# Patient Record
Sex: Male | Born: 1980 | Race: White | Hispanic: No | Marital: Single | Smoking: Former smoker
Health system: Southern US, Community
[De-identification: ages and names within clinical notes are randomized; demographics above are authoritative.]

## PROBLEM LIST (undated history)

## (undated) DIAGNOSIS — J309 Allergic rhinitis, unspecified: Secondary | ICD-10-CM

## (undated) HISTORY — DX: Allergic rhinitis, unspecified: J30.9

---

## 2002-09-07 ENCOUNTER — Emergency Department (HOSPITAL_COMMUNITY): Admission: EM | Admit: 2002-09-07 | Discharge: 2002-09-08 | Payer: Self-pay | Admitting: Emergency Medicine

## 2002-09-07 ENCOUNTER — Encounter: Payer: Self-pay | Admitting: Emergency Medicine

## 2005-07-12 ENCOUNTER — Ambulatory Visit: Payer: Self-pay | Admitting: Internal Medicine

## 2005-08-06 ENCOUNTER — Ambulatory Visit: Payer: Self-pay | Admitting: Internal Medicine

## 2005-11-05 ENCOUNTER — Ambulatory Visit: Payer: Self-pay | Admitting: Urology

## 2005-12-09 ENCOUNTER — Ambulatory Visit: Payer: Self-pay | Admitting: Internal Medicine

## 2006-05-11 ENCOUNTER — Encounter: Payer: Self-pay | Admitting: *Deleted

## 2006-05-11 ENCOUNTER — Ambulatory Visit: Payer: Self-pay | Admitting: Internal Medicine

## 2006-05-11 DIAGNOSIS — H6690 Otitis media, unspecified, unspecified ear: Secondary | ICD-10-CM | POA: Insufficient documentation

## 2006-08-12 ENCOUNTER — Telehealth (INDEPENDENT_AMBULATORY_CARE_PROVIDER_SITE_OTHER): Payer: Self-pay | Admitting: *Deleted

## 2006-08-15 ENCOUNTER — Ambulatory Visit: Payer: Self-pay | Admitting: Internal Medicine

## 2006-08-15 DIAGNOSIS — I951 Orthostatic hypotension: Secondary | ICD-10-CM | POA: Insufficient documentation

## 2006-08-17 LAB — CONVERTED CEMR LAB
ALT: 20 units/L (ref 0–53)
AST: 19 units/L (ref 0–37)
Albumin: 4.5 g/dL (ref 3.5–5.2)
Alkaline Phosphatase: 55 units/L (ref 39–117)
BUN: 7 mg/dL (ref 6–23)
Basophils Absolute: 0.1 10*3/uL (ref 0.0–0.1)
Basophils Relative: 1.2 % — ABNORMAL HIGH (ref 0.0–1.0)
Bilirubin, Direct: 0.1 mg/dL (ref 0.0–0.3)
CO2: 31 meq/L (ref 19–32)
Calcium: 9.4 mg/dL (ref 8.4–10.5)
Chloride: 104 meq/L (ref 96–112)
Creatinine, Ser: 0.9 mg/dL (ref 0.4–1.5)
Eosinophils Absolute: 0 10*3/uL (ref 0.0–0.6)
Eosinophils Relative: 0.3 % (ref 0.0–5.0)
GFR calc Af Amer: 131 mL/min
GFR calc non Af Amer: 108 mL/min
Glucose, Bld: 93 mg/dL (ref 70–99)
HCT: 49.5 % (ref 39.0–52.0)
Hemoglobin: 16.9 g/dL (ref 13.0–17.0)
Lymphocytes Relative: 21.9 % (ref 12.0–46.0)
MCHC: 34.2 g/dL (ref 30.0–36.0)
MCV: 90.4 fL (ref 78.0–100.0)
Monocytes Absolute: 0.2 10*3/uL (ref 0.2–0.7)
Monocytes Relative: 2.6 % — ABNORMAL LOW (ref 3.0–11.0)
Neutro Abs: 6.9 10*3/uL (ref 1.4–7.7)
Neutrophils Relative %: 74 % (ref 43.0–77.0)
Phosphorus: 3.7 mg/dL (ref 2.3–4.6)
Platelets: 315 10*3/uL (ref 150–400)
Potassium: 3.8 meq/L (ref 3.5–5.1)
RBC: 5.48 M/uL (ref 4.22–5.81)
RDW: 11.8 % (ref 11.5–14.6)
Sodium: 142 meq/L (ref 135–145)
TSH: 1.18 microintl units/mL (ref 0.35–5.50)
Total Bilirubin: 0.9 mg/dL (ref 0.3–1.2)
Total Protein: 6.9 g/dL (ref 6.0–8.3)
WBC: 9.2 10*3/uL (ref 4.5–10.5)

## 2006-11-10 ENCOUNTER — Ambulatory Visit: Payer: Self-pay | Admitting: Family Medicine

## 2006-11-10 DIAGNOSIS — J019 Acute sinusitis, unspecified: Secondary | ICD-10-CM | POA: Insufficient documentation

## 2007-02-10 ENCOUNTER — Ambulatory Visit: Payer: Self-pay | Admitting: Family Medicine

## 2007-03-31 ENCOUNTER — Ambulatory Visit: Payer: Self-pay | Admitting: Family Medicine

## 2007-04-26 ENCOUNTER — Ambulatory Visit: Payer: Self-pay | Admitting: Family Medicine

## 2007-04-26 DIAGNOSIS — L259 Unspecified contact dermatitis, unspecified cause: Secondary | ICD-10-CM | POA: Insufficient documentation

## 2007-06-19 ENCOUNTER — Telehealth: Payer: Self-pay | Admitting: Family Medicine

## 2007-06-20 ENCOUNTER — Ambulatory Visit: Payer: Self-pay | Admitting: Family Medicine

## 2007-06-20 DIAGNOSIS — M25519 Pain in unspecified shoulder: Secondary | ICD-10-CM | POA: Insufficient documentation

## 2007-08-04 ENCOUNTER — Encounter: Payer: Self-pay | Admitting: Family Medicine

## 2007-09-28 ENCOUNTER — Ambulatory Visit: Payer: Self-pay | Admitting: Family Medicine

## 2007-09-28 DIAGNOSIS — R1031 Right lower quadrant pain: Secondary | ICD-10-CM | POA: Insufficient documentation

## 2007-09-28 LAB — CONVERTED CEMR LAB
Blood in Urine, dipstick: NEGATIVE
Glucose, Urine, Semiquant: NEGATIVE
Protein, U semiquant: NEGATIVE
Urobilinogen, UA: 0.2
WBC Urine, dipstick: NEGATIVE
pH: 7

## 2008-04-23 ENCOUNTER — Ambulatory Visit: Payer: Self-pay | Admitting: Family Medicine

## 2008-04-23 DIAGNOSIS — F17200 Nicotine dependence, unspecified, uncomplicated: Secondary | ICD-10-CM | POA: Insufficient documentation

## 2008-08-29 ENCOUNTER — Ambulatory Visit: Payer: Self-pay | Admitting: Family Medicine

## 2008-08-29 DIAGNOSIS — M24819 Other specific joint derangements of unspecified shoulder, not elsewhere classified: Secondary | ICD-10-CM | POA: Insufficient documentation

## 2008-09-05 ENCOUNTER — Encounter: Payer: Self-pay | Admitting: Family Medicine

## 2008-10-08 ENCOUNTER — Encounter: Payer: Self-pay | Admitting: Family Medicine

## 2008-10-09 ENCOUNTER — Ambulatory Visit: Payer: Self-pay | Admitting: Family Medicine

## 2009-01-30 ENCOUNTER — Ambulatory Visit: Payer: Self-pay | Admitting: Family Medicine

## 2009-01-30 DIAGNOSIS — J069 Acute upper respiratory infection, unspecified: Secondary | ICD-10-CM | POA: Insufficient documentation

## 2009-04-10 ENCOUNTER — Ambulatory Visit: Payer: Self-pay | Admitting: Family Medicine

## 2009-04-10 LAB — CONVERTED CEMR LAB
Urobilinogen, UA: 0.2
WBC Urine, dipstick: NEGATIVE
pH: 6

## 2009-04-11 LAB — CONVERTED CEMR LAB
Basophils Absolute: 0 10*3/uL (ref 0.0–0.1)
Basophils Relative: 0 % (ref 0.0–3.0)
Eosinophils Absolute: 0 10*3/uL (ref 0.0–0.7)
Eosinophils Relative: 0.2 % (ref 0.0–5.0)
Lymphocytes Relative: 28.8 % (ref 12.0–46.0)
MCHC: 33.3 g/dL (ref 30.0–36.0)
Monocytes Absolute: 0.6 10*3/uL (ref 0.1–1.0)
RDW: 11.9 % (ref 11.5–14.6)
WBC: 9.1 10*3/uL (ref 4.5–10.5)

## 2010-03-03 NOTE — Miscellaneous (Signed)
Summary: PT Initial Note/[facility]  PT Initial Note/[facility]   Imported By: Lanelle Bal 09/17/2008 14:44:55  _____________________________________________________________________  External Attachment:    Type:   Image     Comment:   External Document

## 2010-03-03 NOTE — Assessment & Plan Note (Signed)
Summary: pain in stomach/ alc   Vital Signs:  Patient profile:   30 year old male Height:      68 inches Weight:      134.13 pounds BMI:     20.47 Temp:     98.1 degrees F oral Pulse rate:   76 / minute Pulse rhythm:   regular BP sitting:   110 / 72  (left arm) Cuff size:   regular  Vitals Entered By: Benny Lennert CMA Duncan Dull) (April 10, 2009 10:47 AM) CC: pain in stomach   History of Present Illness: 30 year old male:  Pain in the belly button and shooting down to his penis. no std exposure, no penile discharge. some diffuse achiness and not feeling that good. week. hurt with peeing  feel pretty bad no d, no nausea no rashes  no uri type signs   ros above  GEN: WDWN, NAD, Non-toxic, A & O x 3 HEENT: Atraumatic, Normocephalic. Neck supple. No masses, No LAD. Ears and Nose: No external deformity. CV: RRR, No M/G/R. No JVD. No thrill. No extra heart sounds. PULM: CTA B, no wheezes, crackles, rhonchi. No retractions. No resp. distress. No accessory muscle use. ABD: S, NT, ND, +BS. No rebound tenderness. No HSM.  EXTR: No c/c/e NEURO: Normal gait.  PSYCH: Normally interactive. Conversant. Not depressed or anxious appearing.  Calm demeanor.      Allergies: 1)  * Allegra (Fexofenadine)  Past History:  Past medical, surgical, family and social histories (including risk factors) reviewed, and no changes noted (except as noted below).  Past Medical History: Reviewed history from 08/26/2006 and no changes required. Allergic rhinitis  Family History: Reviewed history from 08/26/2006 and no changes required. Father: Alive Mother: Alive Doesn't really know his family's medical hx  Social History: Reviewed history from 08/26/2006 and no changes required. Marital Status: Single Children: None Occupation: Engineer, maintenance Current Smoker Alcohol use-no   Impression & Recommendations:  Problem # 1:  VIRAL INFECTION-UNSPEC (ICD-079.99) Assessment New probable  viral syndrome, normal UA, normal CBC  reassurance, supportive care.  The following medications were removed from the medication list:    Nyquil 60-7.06-30-998 Mg/10ml Liqd (Pseudoeph-doxylamine-dm-apap) ..... Otc as directed.    Dayquil Multi-symptom 30-325-10 Mg/68ml Liqd (Pseudoephedrine-apap-dm) ..... Otc as directed.  Orders: Venipuncture (82956) TLB-CBC Platelet - w/Differential (85025-CBCD) UA Dipstick w/o Micro (manual) (21308)  Prior Medications (reviewed today): None Current Allergies (reviewed today): * ALLEGRA (FEXOFENADINE)  Laboratory Results   Urine Tests  Date/Time Received: April 10, 2009 11:11 AM  Date/Time Reported: April 10, 2009 11:11 AM   Routine Urinalysis   Color: yellow Appearance: Clear Glucose: negative   (Normal Range: Negative) Bilirubin: negative   (Normal Range: Negative) Ketone: negative   (Normal Range: Negative) Spec. Gravity: 1.015   (Normal Range: 1.003-1.035) Blood: negative   (Normal Range: Negative) pH: 6.0   (Normal Range: 5.0-8.0) Protein: negative   (Normal Range: Negative) Urobilinogen: 0.2   (Normal Range: 0-1) Nitrite: negative   (Normal Range: Negative) Leukocyte Esterace: negative   (Normal Range: Negative)

## 2010-07-09 ENCOUNTER — Ambulatory Visit (INDEPENDENT_AMBULATORY_CARE_PROVIDER_SITE_OTHER): Payer: BC Managed Care – PPO | Admitting: Family Medicine

## 2010-07-09 ENCOUNTER — Encounter: Payer: Self-pay | Admitting: Family Medicine

## 2010-07-09 VITALS — BP 100/70 | HR 72 | Temp 98.6°F | Ht 68.0 in | Wt 135.2 lb

## 2010-07-09 DIAGNOSIS — J0100 Acute maxillary sinusitis, unspecified: Secondary | ICD-10-CM

## 2010-07-09 MED ORDER — DOXYCYCLINE HYCLATE 100 MG PO TABS: 100.0000 mg | ORAL_TABLET | Freq: Two times a day (BID) | ORAL | Status: AC

## 2010-07-09 NOTE — Patient Instructions (Signed)
Suggestions  TREATMENT 1. Drink plenty of fluids, but limit caffeine 2. Decongestant: for congested noses, sinuses, and ear tubes. Pressure release and help drainage: Sudafed (pseudephedrine or Phenylephrine) (NOT IF YOU HAVE HIGH BLOOD PRESSURE) 3. Nasal Sprays: Relieve pressure, promote drainage, open nasal and ear passages. Afrin or Neosynephrine can be used for only 3-4 days in row. 4. Nasal Saline: Moisten and smooth membranes, no side effects 5. Cough Suppressants: Several types over the counter such as DM. Codeine and Hydrocodone are prescription narcotic medicines that are powerful cough suppressants. DM cough suppressant usually are well tolerated with minimal side effects 6. Expectorants: Liquify secretions and improve drainage. (ex=Robitussin or Mucinex)  Combinations: often have combination of decongestant, cough suppressant, and expectorant. NO BRAND IS PARTICULARLY BETTER THAN ANOTHER - THEY WILL MAKE YOU FEEL BETTER FOR A WHILE, SO YOU CAN DEAL WITH YOUR SYMPTOMS. YOUR BODY HAS TO HEAL ITSELF.  COMBINATION EXAMPLES: Mucinex-D (expectorant and decongestant) Dayquil and Nyquil Tylenol or Advil Sinus, Cold, or Flu Theraflu Dimetapp  Work well, but read label so you do not combine multiple meds.  IF CONFUSED AT ALL: YOU CAN ALWAYS ASK THE PHARMACIST TO HELP

## 2010-07-09 NOTE — Progress Notes (Signed)
  Subjective:     Seth Bates is a 30 y.o. male who presents for evaluation of sinus pain. Symptoms include: clear rhinorrhea, congestion, facial pain, fevers, headaches, nasal congestion and sore throat. Onset of symptoms was 10 days ago. Symptoms have been gradually worsening since that time. Past history is significant for tick bite. Patient is a smoker  (1 ppd x 10 yrs).  The PMH, PSH, Social History, Family History, Medications, and allergies have been reviewed in [facility], and have been updated if relevant.  Review of Systems ROS: GEN: Acute illness details above GI: Tolerating PO intake GU: maintaining adequate hydration and urination Pulm: No SOB Interactive and getting along well at home.  Otherwise, ROS is as per the HPI.   Objective:     Physical Exam  Blood pressure 100/70, pulse 72, temperature 98.6 F (37 C), temperature source Oral, height 5\' 8"  (1.727 m), weight 135 lb 4 oz (61.349 kg).  Gen: WDWN, NAD; alert,appropriate and cooperative throughout exam  HEENT: Normocephalic and atraumatic. Throat clear, w/o exudate, no LAD, R TM clear, L TM - good landmarks, No fluid present. rhinnorhea.  Left frontal and maxillary sinuses: Tender max Right frontal and maxillary sinuses: Tender max  Neck: No ant or post LAD CV: RRR, No M/G/R Pulm: Breathing comfortably in no resp distress. no w/c/r Extr: no c/c/e Psych: full affect, pleasant   Assessment:    Acute bacterial sinusitis.  Tick bite   Plan:    Nasal saline sprays. Placed on doxy given tick bite per medication orders.

## 2011-02-05 ENCOUNTER — Ambulatory Visit (INDEPENDENT_AMBULATORY_CARE_PROVIDER_SITE_OTHER): Payer: BC Managed Care – PPO | Admitting: Family Medicine

## 2011-02-05 ENCOUNTER — Encounter: Payer: Self-pay | Admitting: Family Medicine

## 2011-02-05 ENCOUNTER — Encounter: Payer: Self-pay | Admitting: Internal Medicine

## 2011-02-05 VITALS — BP 138/96 | HR 84 | Temp 98.4°F | Resp 20 | Ht 66.0 in | Wt 138.8 lb

## 2011-02-05 DIAGNOSIS — J069 Acute upper respiratory infection, unspecified: Secondary | ICD-10-CM

## 2011-02-05 DIAGNOSIS — U071 COVID-19: Secondary | ICD-10-CM | POA: Insufficient documentation

## 2011-02-05 MED ORDER — GUAIFENESIN-CODEINE 100-10 MG/5ML PO SYRP: 5.0000 mL | ORAL_SOLUTION | Freq: Two times a day (BID) | ORAL | Status: AC | PRN

## 2011-02-05 NOTE — Assessment & Plan Note (Signed)
Supportive care. cheratussin for cough. Update [place] if not improving as expected.

## 2011-02-05 NOTE — Progress Notes (Signed)
  Subjective:    Patient ID: Seth Bates, male    DOB: [birth year], 30 y.o.   MRN: [id number]  HPI CC: ST, cold sxs  4-5 d h/o cold sxs, then started moving up into head.  + eye pain, tooth pain, ears muffled.  Clearing up some.  Some ST.  Mild cough productive of mucous as well as out of nose.  Some stomach upset.  Has tried sinus and cold medicine (?decongestant attributed to elevated bp today).  Also taking mucinex.  No fevers/chills, nausea/vomiting, abd pain, diarrhea, rashes.  No chest pain, SOB.  + wife and daughter sick recently.  Smoking 1 ppd.  No h/o asthma, COPD.  Review of Systems Per HPI    Objective:   Physical Exam  Nursing note and vitals reviewed. Constitutional: He appears well-developed and well-nourished. No distress.  HENT:  Head: Normocephalic and atraumatic.  Right Ear: Hearing, tympanic membrane, external ear and ear canal normal.  Left Ear: Hearing, tympanic membrane, external ear and ear canal normal.  Nose: Nose normal. No mucosal edema or rhinorrhea. Right sinus exhibits no maxillary sinus tenderness and no frontal sinus tenderness. Left sinus exhibits no maxillary sinus tenderness and no frontal sinus tenderness.  Mouth/Throat: Uvula is midline, oropharynx is clear and moist and mucous membranes are normal. No oropharyngeal exudate, posterior oropharyngeal edema, posterior oropharyngeal erythema or tonsillar abscesses.       Some congested TM L>R  Eyes: Conjunctivae and EOM are normal. Pupils are equal, round, and reactive to light. No scleral icterus.  Neck: Normal range of motion. Neck supple.  Cardiovascular: Normal rate, regular rhythm, normal heart sounds and intact distal pulses.   No murmur heard. Pulmonary/Chest: Effort normal and breath sounds normal. No respiratory distress. He has no wheezes. He has no rales.  Lymphadenopathy:    He has no cervical adenopathy.  Skin: Skin is warm and dry. No rash noted.       Assessment & Plan:

## 2011-02-05 NOTE — Patient Instructions (Signed)
Sounds like you have a viral upper respiratory infection. Antibiotics are not needed for this.  Viral infections usually take 7-10 days to resolve.  The cough can last several weeks to go away. Use medication as prescribed: cheratussin for cough at night (may make you sleepy) Push fluids and plenty of rest. May use simple mucinex with plenty of fluid to mobilize mucous. if you have high fevers (>101.5) or worsening productive cough, or symptoms going on past 10 days, please call us. Call clinic with questions.  Good to see you today.

## 2011-02-08 ENCOUNTER — Encounter: Payer: Self-pay | Admitting: *Deleted

## 2011-02-08 ENCOUNTER — Encounter: Payer: Self-pay | Admitting: Family Medicine

## 2011-02-08 ENCOUNTER — Ambulatory Visit (INDEPENDENT_AMBULATORY_CARE_PROVIDER_SITE_OTHER): Payer: BC Managed Care – PPO | Admitting: Family Medicine

## 2011-02-08 DIAGNOSIS — B309 Viral conjunctivitis, unspecified: Secondary | ICD-10-CM | POA: Insufficient documentation

## 2011-02-08 DIAGNOSIS — J069 Acute upper respiratory infection, unspecified: Secondary | ICD-10-CM

## 2011-02-08 NOTE — Patient Instructions (Signed)
Sounds like you have developed pink eye - treat with warm compresses to eye several times daily, lubricating eye drops like hypotears or rephresh, wash pillow cloth at night time to prevent spread, wash hands. If worsening discharge, call me for antibiotic eye drops. If not improving cold symptoms by Wednesday or Thursday, call me as well .  Viral Conjunctivitis Conjunctivitis is an irritation (inflammation) of the clear membrane that covers the white part of the eye (the conjunctiva). The irritation can also happen on the underside of the eyelids. Conjunctivitis makes the eye red or pink in color. This is what is commonly known as pink eye. Viral conjunctivitis can spread easily (contagious). CAUSES   Infection from virus on the surface of the eye.   Infection from the irritation or injury of nearby tissues such as the eyelids or cornea.   More serious inflammation or infection on the inside of the eye.   Other eye diseases.   The use of certain eye medications.  SYMPTOMS  The normally white color of the eye or the underside of the eyelid is usually pink or red in color. The pink eye is usually associated with irritation, tearing and some sensitivity to light. Viral conjunctivitis is often associated with a clear, watery discharge. If a discharge is present, there may also be some blurred vision in the affected eye. DIAGNOSIS  Conjunctivitis is diagnosed by an eye exam. The eye specialist looks for changes in the surface tissues of the eye which take on changes characteristic of the specific types of conjunctivitis. A sample of any discharge may be collected on a Q-Tip (sterile swap). The sample will be sent to a lab to see whether or not the inflammation is caused by bacterial or viral infection. TREATMENT  Viral conjunctivitis will not respond to medicines that kill germs (antibiotics). Treatment is aimed at stopping a bacterial infection on top of the viral infection. The goal of treatment  is to relieve symptoms (such as itching) with antihistamine drops or other eye medications.  HOME CARE INSTRUCTIONS   To ease discomfort, apply a cool, clean wash cloth to your eye for 10 to 20 minutes, 3 to 4 times a day.   Gently wipe away any drainage from the eye with a warm, wet washcloth or a cotton ball.   Wash your hands often with soap and use paper towels to dry.   Do not share towels or washcloths. This may spread the infection.   Change or wash your pillowcase every day.   You should not use eye make-up until the infection is gone.   Stop using contacts lenses. Ask your eye professional how to sterilize or replace them before using again. This depends on the type of contact lenses used.   Do not touch the edge of the eyelid with the eye drop bottle or ointment tube when applying medications to the affected eye. This will stop you from spreading the infection to the other eye or to others.  SEEK IMMEDIATE MEDICAL CARE IF:   The infection has not improved within 3 days of beginning treatment.   A watery discharge from the eye develops.   Pain in the eye increases.   The redness is spreading.   Vision becomes blurred.   An oral temperature above 102 F (38.9 C) develops, or as your caregiver suggests.   Facial pain, redness or swelling develops.   Any problems that may be related to the prescribed medicine develop.  MAKE SURE YOU:  Understand these instructions.   Will watch your condition.   Will get help right away if you are not doing well or get worse.  Document Released: 01/18/2005 Document Revised: 09/30/2010 Document Reviewed: 09/07/2007 [facility] Patient Information 2012 [place], [place].

## 2011-02-08 NOTE — Assessment & Plan Note (Signed)
Resolving.  Update [place] if not improving as expected for tx possible sinusitis.

## 2011-02-08 NOTE — Progress Notes (Signed)
  Subjective:    Patient ID: Seth Bates, male    DOB: 1980-10-03, 31 y.o.   MRN: [id number]  HPI CC: f/u, red eye  Seen here 02/05/2011 with dx viral URTI with cough, treated with cheratussin which helps at night.  Woke up Saturday morning with red eye, green matted discharge in am.  When awakening and blows nose, blood tinged mucous.  Nasal congestion improving.  + HA when awakens.  + tooth pain on left.  sxs now going on for 8 days but seem to be improving.  Denies blurry vision, vision changes.  No pain with eye movements but eye staying some sore.  no fevers/chills, nausea.  No ear pain.  trying to back down on smoking.  See prior note.  Review of Systems Per HPI    Objective:   Physical Exam  Nursing note and vitals reviewed. Constitutional: He appears well-developed and well-nourished. No distress.  HENT:  Head: Normocephalic and atraumatic.  Right Ear: Hearing, tympanic membrane, external ear and ear canal normal.  Left Ear: Hearing, tympanic membrane, external ear and ear canal normal.  Nose: Nose normal. No mucosal edema or rhinorrhea. Right sinus exhibits no maxillary sinus tenderness and no frontal sinus tenderness. Left sinus exhibits no maxillary sinus tenderness and no frontal sinus tenderness.  Mouth/Throat: Uvula is midline and mucous membranes are normal. Posterior oropharyngeal erythema present. No oropharyngeal exudate, posterior oropharyngeal edema or tonsillar abscesses.  Eyes: EOM are normal. Pupils are equal, round, and reactive to light. Right conjunctiva is injected. Right conjunctiva has no hemorrhage. No scleral icterus. Right eye exhibits normal extraocular motion and no nystagmus. Left eye exhibits normal extraocular motion and no nystagmus.         Limbic sparing.  + bulbar conjunctivitis Nl eye movements without pain  Neck: Normal range of motion. Neck supple.  Cardiovascular: Normal rate, regular rhythm, normal heart sounds and intact distal  pulses.   No murmur heard. Pulmonary/Chest: Effort normal and breath sounds normal. No respiratory distress. He has no wheezes. He has no rales.  Lymphadenopathy:    He has no cervical adenopathy.  Skin: Skin is warm and dry. No rash noted.       Assessment & Plan:

## 2011-02-08 NOTE — Assessment & Plan Note (Signed)
After viral URTI. Supportive care, see pt instructions. No pain with eye movements, no proptosis

## 2011-10-21 ENCOUNTER — Encounter: Payer: Self-pay | Admitting: Family Medicine

## 2011-10-21 ENCOUNTER — Ambulatory Visit (INDEPENDENT_AMBULATORY_CARE_PROVIDER_SITE_OTHER): Payer: BC Managed Care – PPO | Admitting: Family Medicine

## 2011-10-21 ENCOUNTER — Encounter: Payer: Self-pay | Admitting: *Deleted

## 2011-10-21 VITALS — BP 110/80 | HR 80 | Temp 98.2°F | Wt 141.2 lb

## 2011-10-21 DIAGNOSIS — K529 Noninfective gastroenteritis and colitis, unspecified: Secondary | ICD-10-CM | POA: Insufficient documentation

## 2011-10-21 DIAGNOSIS — K5289 Other specified noninfective gastroenteritis and colitis: Secondary | ICD-10-CM

## 2011-10-21 DIAGNOSIS — F17200 Nicotine dependence, unspecified, uncomplicated: Secondary | ICD-10-CM

## 2011-10-21 MED ORDER — CIPROFLOXACIN HCL 500 MG PO TABS: 500.0000 mg | ORAL_TABLET | Freq: Two times a day (BID) | ORAL | Status: DC

## 2011-10-21 NOTE — Assessment & Plan Note (Signed)
Continue to encourage cessation. 

## 2011-10-21 NOTE — Assessment & Plan Note (Addendum)
Nontoxic today, well hydrated. Anticipate viral enteritis, but given # of BM's/day today, provided with script for cipro to fill if not improving as expected. Discussed importance of hydration status.

## 2011-10-21 NOTE — Patient Instructions (Addendum)
I think you have enteritis - or bowel infection. Should improve on own. If not improving as expected, fill antibiotic.  If worsening, please return. Get plenty of rest and plenty of fluids throughout the day Out of work today.

## 2011-10-21 NOTE — Progress Notes (Signed)
  Subjective:    Patient ID: Seth Bates, male    DOB: 1980-06-07, 31 y.o.   MRN: [id number]  HPI CC: abd pain, diarrhea  Sxs started Saturday night (5d ago) with mild soreness RLL.  For last 5 days, intermittent lower abd cramping associated with diarrhea.  Diarrhea loose not watery, today some mucous.  This has been associated with malaise.  Diarrhea 10-15 x today.  Appetite ok.  No recent abx use.  Hasn't tried anything OTC for this.  No fevers/chills, blood in stool, melena, nausea/vomiting.  No dysuria, no viral sxs (RN, cough, ST, sneezing)  No sick contacts at home.  No new foods, new restaurants.  Past Medical History  Diagnosis Date  . Allergy     allergic rhnitis     Review of Systems Per HPI    Objective:   Physical Exam  Nursing note and vitals reviewed. Constitutional: He appears well-developed and well-nourished. No distress.  HENT:  Head: Normocephalic and atraumatic.  Mouth/Throat: Oropharynx is clear and moist. No oropharyngeal exudate.  Neck: Normal range of motion. Neck supple.  Cardiovascular: Normal rate, regular rhythm, normal heart sounds and intact distal pulses.   No murmur heard. Pulmonary/Chest: Effort normal and breath sounds normal. No respiratory distress. He has no wheezes. He has no rales.  Abdominal: Soft. Bowel sounds are normal. He exhibits no distension and no mass. There is no hepatosplenomegaly. There is tenderness (mild) in the suprapubic area. There is no rebound, no guarding and no CVA tenderness.  Musculoskeletal: He exhibits no edema.  Lymphadenopathy:    He has no cervical adenopathy.  Skin: Skin is warm and dry. No rash noted.       Assessment & Plan:

## 2012-02-07 ENCOUNTER — Ambulatory Visit (INDEPENDENT_AMBULATORY_CARE_PROVIDER_SITE_OTHER): Payer: BC Managed Care – PPO | Admitting: Family Medicine

## 2012-02-07 ENCOUNTER — Encounter: Payer: Self-pay | Admitting: Family Medicine

## 2012-02-07 VITALS — BP 122/86 | HR 83 | Temp 98.4°F | Wt 138.5 lb

## 2012-02-07 DIAGNOSIS — R42 Dizziness and giddiness: Secondary | ICD-10-CM | POA: Insufficient documentation

## 2012-02-07 DIAGNOSIS — T7840XA Allergy, unspecified, initial encounter: Secondary | ICD-10-CM

## 2012-02-07 DIAGNOSIS — J309 Allergic rhinitis, unspecified: Secondary | ICD-10-CM

## 2012-02-07 MED ORDER — FLUTICASONE PROPIONATE 50 MCG/ACT NA SUSP: 2.0000 | Freq: Every day | NASAL | Status: DC

## 2012-02-07 MED ORDER — MECLIZINE HCL 25 MG PO TABS: 25.0000 mg | ORAL_TABLET | Freq: Three times a day (TID) | ORAL | Status: DC | PRN

## 2012-02-07 NOTE — Assessment & Plan Note (Signed)
Peripheral vertigo - ?labrynthitis vs vest neuritis. Not consistent with BPPV, neg dix hallpike. Not consistent with central cause. Discussed dx and treatment plan.  Use meclizine prn. ?allergic rhinitis related - trial of flonase.  See below.

## 2012-02-07 NOTE — Patient Instructions (Signed)
I wonder if this is labrynthitis.  It should get better with time. Treat with meclizine. For allergies, use flonase and over the counter nasal saline. Watch for nose bleeds on flonase, if happening frequently then stop  Labyrinthitis (Inner Ear Inflammation) Your exam shows you have an inner ear disturbance or labyrinthitis. The cause of this condition is not known. But it may be due to a virus infection. The symptoms of labyrinthitis include vertigo or dizziness made worse by motion, nausea and vomiting. The onset of labyrinthitis may be very sudden. It usually lasts for a few days and then clears up over 1-2 weeks. The treatment of an inner ear disturbance includes bed rest and medications to reduce dizziness, nausea, and vomiting. You should stay away from alcohol, tranquilizers, caffeine, nicotine, or any medicine your doctor thinks may make your symptoms worse. Further testing may be needed to evaluate your hearing and balance system. Please see your doctor or go to the emergency room right away if you have:  Increasing vertigo, earache, loss of hearing, or ear drainage.  Headache, blurred vision, trouble walking, fainting, or fever.  Persistent vomiting, dehydration, or extreme weakness. Document Released: 01/18/2005 Document Revised: 04/12/2011 Document Reviewed: 07/06/2006 [facility] Patient Information 2013 [place], [place].

## 2012-02-07 NOTE — Progress Notes (Signed)
  Subjective:    Patient ID: Seth Bates, male    DOB: 1980-07-13, 32 y.o.   MRN: [id number]  HPI CC: sinus congestion  Several month history of sinus pressure/pain and congested ears.  1.5 wks ago, noticed vertigo sxs that last a few hours at a time.  Resolved after emesis.  2nd episode occurred 2d ago when woke up and got out of bed. Room spinning lasted a few minutes, then nonspecific dizziness for several hours.  No emesis that time.  Missed work last week 2/2 this. Occasional L ear pain and bilateral drainage intermittently.  No headaches, vision changes, fevers/chills, recent viral illness or sore throat.  Denies palpitations, chest pain, sob, LOC or presyncope.  Has had vertigo/emesis in past with drinking.  Doesn't drink anymore.  Smoking - cutting back, down to <1/2 ppd. H/o allergic rhinitis in past.  Zyrtec didn't really help.  Tylenol cold/sinus didn't really help in past.  Past Medical History  Diagnosis Date  . Allergy     allergic rhnitis     Review of Systems Per HPI    Objective:   Physical Exam  Nursing note and vitals reviewed. Constitutional: He is oriented to person, place, and time. He appears well-developed and well-nourished. No distress.  HENT:  Head: Normocephalic and atraumatic.  Right Ear: Hearing, tympanic membrane, external ear and ear canal normal.  Left Ear: Hearing, tympanic membrane, external ear and ear canal normal.  Nose: Mucosal edema present. No rhinorrhea. Right sinus exhibits no maxillary sinus tenderness and no frontal sinus tenderness. Left sinus exhibits no maxillary sinus tenderness and no frontal sinus tenderness.  Mouth/Throat: Uvula is midline, oropharynx is clear and moist and mucous membranes are normal. No oropharyngeal exudate, posterior oropharyngeal edema, posterior oropharyngeal erythema or tonsillar abscesses.       Nasal mucosal irritation.  Eyes: Conjunctivae normal and EOM are normal. Pupils are equal, round, and  reactive to light. No scleral icterus.  Neck: Normal range of motion. Neck supple.  Cardiovascular: Normal rate, regular rhythm, normal heart sounds and intact distal pulses.   No murmur heard. Pulmonary/Chest: Effort normal and breath sounds normal. No respiratory distress. He has no wheezes. He has no rales.  Lymphadenopathy:    He has no cervical adenopathy.  Neurological: He is alert and oriented to person, place, and time. No cranial nerve deficit or sensory deficit. He displays a negative Romberg sign. Coordination and gait normal.       CN 2-12 intact Station and gait intact Normal FTN Neg dix hallpike No nystagmus.       Assessment & Plan:

## 2012-02-07 NOTE — Assessment & Plan Note (Signed)
Possibly sinus congestion contributing to vertigo. Treat with flonase. Did not respond to antihistamine trial (zyrtec)

## 2012-02-24 ENCOUNTER — Ambulatory Visit (INDEPENDENT_AMBULATORY_CARE_PROVIDER_SITE_OTHER): Payer: BC Managed Care – PPO | Admitting: Family Medicine

## 2012-02-24 ENCOUNTER — Encounter: Payer: Self-pay | Admitting: Family Medicine

## 2012-02-24 VITALS — BP 116/88 | HR 84 | Temp 98.2°F | Wt 135.0 lb

## 2012-02-24 DIAGNOSIS — J209 Acute bronchitis, unspecified: Secondary | ICD-10-CM | POA: Insufficient documentation

## 2012-02-24 MED ORDER — AZITHROMYCIN 250 MG PO TABS: ORAL_TABLET | ORAL | Status: DC

## 2012-02-24 MED ORDER — GUAIFENESIN-CODEINE 100-10 MG/5ML PO SYRP: 5.0000 mL | ORAL_SOLUTION | Freq: Every evening | ORAL | Status: DC | PRN

## 2012-02-24 NOTE — Patient Instructions (Signed)
You have bronchitis. Use medication as prescribed: zpack as well as cheratussin cough syrup for cough Continue simple mucinex or guaifenesin with plenty of fluid to mobilize mucous. Push fluids and plenty of rest. Please return if you are not improving as expected, or if you have high fevers (>101.5) or difficulty swallowing or worsening productive cough. Call clinic with questions.  Good to see you today.

## 2012-02-24 NOTE — Progress Notes (Signed)
  Subjective:    Patient ID: Seth Bates, male    DOB: 10/12/1980, 32 y.o.   MRN: [id number]  HPI CC: cough  Seen here 02/07/2012 with vertigo and sinus congestion, thought allergy related as well as possible labrynthitis - treated with meclizine and flonase.  Never took meclizine.  Used flonase for a few days then started feeling ill.    Now presents with 2 wk h/o illness that started with chills/body aches and progressed to cough.  Body aches and chills returned yesterday.  Feels congestion in chest.  Having coughing fits.  This morning with headache, head and chest congestion.  + PNDrainage.  ST and hoarse voice noted.  No post tussive emesis, + post tussive dizziness.  Tried OTC remedies.    No fevers, abd pain, ear or tooth pain, rashes.    Smoking - trying to cut back <1/2 ppd. Sick contacts at work. No h/o asthma. Did not get flu shot this year.  Past Medical History  Diagnosis Date  . Allergic rhinitis     Review of Systems Per HPI    Objective:   Physical Exam  Nursing note and vitals reviewed. Constitutional: He appears well-developed and well-nourished. No distress.  HENT:  Head: Normocephalic and atraumatic.  Right Ear: Hearing, tympanic membrane, external ear and ear canal normal.  Left Ear: Hearing, tympanic membrane, external ear and ear canal normal.  Nose: Nose normal. No mucosal edema or rhinorrhea. Right sinus exhibits no maxillary sinus tenderness and no frontal sinus tenderness. Left sinus exhibits no maxillary sinus tenderness and no frontal sinus tenderness.  Mouth/Throat: Uvula is midline, oropharynx is clear and moist and mucous membranes are normal. No oropharyngeal exudate, posterior oropharyngeal edema, posterior oropharyngeal erythema or tonsillar abscesses.  Eyes: Conjunctivae normal and EOM are normal. Pupils are equal, round, and reactive to light. No scleral icterus.  Neck: Normal range of motion. Neck supple.  Cardiovascular: Normal rate,  regular rhythm, normal heart sounds and intact distal pulses.   No murmur heard. Pulmonary/Chest: Effort normal and breath sounds normal. No respiratory distress. He has no wheezes. He has no rales.       Overall clear, some squeak/rhonchi L lower lobe  Lymphadenopathy:    He has no cervical adenopathy.  Skin: Skin is warm and dry. No rash noted.       Assessment & Plan:

## 2012-02-24 NOTE — Assessment & Plan Note (Signed)
Given recent deterioration, will cover for bacterial bronchitis with zpack. cheratussin for cough at night. Update [place] if sxs persist or worsen. See pt instructions for further supportive care

## 2012-10-05 ENCOUNTER — Ambulatory Visit (INDEPENDENT_AMBULATORY_CARE_PROVIDER_SITE_OTHER): Payer: BC Managed Care – PPO | Admitting: Family Medicine

## 2012-10-05 ENCOUNTER — Encounter: Payer: Self-pay | Admitting: Family Medicine

## 2012-10-05 VITALS — BP 110/82 | HR 80 | Temp 98.5°F | Wt 137.2 lb

## 2012-10-05 DIAGNOSIS — J309 Allergic rhinitis, unspecified: Secondary | ICD-10-CM

## 2012-10-05 MED ORDER — MOMETASONE FUROATE 50 MCG/ACT NA SUSP: 2.0000 | Freq: Every day | NASAL | Status: DC

## 2012-10-05 NOTE — Patient Instructions (Signed)
I think this is an allergy flare -  Change zyrtec to claritin daily. Change flonase to nasonex daily. Start taking immediate release guaifenesin with plenty of water to help mobilize mucous. Start nasal saline irrigation throughout the day. Watch for fever >101 or worsening cough or not improving with this.  If worsening, call me and let me know.

## 2012-10-05 NOTE — Assessment & Plan Note (Signed)
I don't see evidence of infection today. Will treat with change from zyrtec to claritin and from flonase to nasonex.   Increase use of nasal saline and guaifenesin. Red flags to return discussed. Pt agrees with plan.

## 2012-10-05 NOTE — Progress Notes (Signed)
  Subjective:    Patient ID: Seth Bates, male    DOB: [birth year], 32 y.o.   MRN: [id number]  HPI CC: "sinuses are bothering me"  Several week history of head congestion - feels fluid and pressure in ears, associated with R earache.  Itchy watery eyes, pressure headache behind eyes.  + PNDrainage especially in the morning.   Some abd pain described as RLQ ache associated with some diarrhea.  This has been going on for last few weeks as well.  Denies fevers/chills, cough, nausea/vomiting, tooth pain, ST, new rashes.  Aleve helps with headache. Compliant with zyrtec daily.  Does not take flonase - caused sore throat.  No sick contacts at home. No asthma/COPD hx. Smoking 1/2 ppd.  Past Medical History  Diagnosis Date  . Allergic rhinitis      Review of Systems Per HPI    Objective:   Physical Exam  Nursing note and vitals reviewed. Constitutional: He appears well-developed and well-nourished. No distress.  HENT:  Head: Normocephalic and atraumatic.  Right Ear: Hearing, tympanic membrane, external ear and ear canal normal.  Left Ear: Hearing, tympanic membrane, external ear and ear canal normal.  Nose: Mucosal edema present. No rhinorrhea. Right sinus exhibits maxillary sinus tenderness. Right sinus exhibits no frontal sinus tenderness. Left sinus exhibits maxillary sinus tenderness. Left sinus exhibits no frontal sinus tenderness.  Mouth/Throat: Uvula is midline, oropharynx is clear and moist and mucous membranes are normal. No oropharyngeal exudate, posterior oropharyngeal edema, posterior oropharyngeal erythema or tonsillar abscesses.  L>R swollen turbinates  Eyes: Conjunctivae and EOM are normal. Pupils are equal, round, and reactive to light. No scleral icterus.  Neck: Normal range of motion. Neck supple. No thyromegaly present.  Cardiovascular: Normal rate, regular rhythm, normal heart sounds and intact distal pulses.   No murmur heard. Pulmonary/Chest: Effort  normal and breath sounds normal. No respiratory distress. He has no wheezes. He has no rales.  Abdominal: Soft. Normal appearance and bowel sounds are normal. He exhibits no distension, no ascites and no mass. There is no hepatosplenomegaly. There is tenderness (mild) in the right upper quadrant. There is no rigidity, no rebound, no guarding, no CVA tenderness and negative Murphy's sign.  Lymphadenopathy:    He has no cervical adenopathy.  Skin: Skin is warm and dry. No rash noted.       Assessment & Plan:

## 2013-02-12 ENCOUNTER — Ambulatory Visit (INDEPENDENT_AMBULATORY_CARE_PROVIDER_SITE_OTHER): Payer: BC Managed Care – PPO | Admitting: Family Medicine

## 2013-02-12 ENCOUNTER — Encounter: Payer: Self-pay | Admitting: Family Medicine

## 2013-02-12 ENCOUNTER — Ambulatory Visit (INDEPENDENT_AMBULATORY_CARE_PROVIDER_SITE_OTHER)
Admission: RE | Admit: 2013-02-12 | Discharge: 2013-02-12 | Disposition: A | Discharge status: Home / Self Care | Payer: BC Managed Care – PPO | Source: Ambulatory Visit | Attending: Family Medicine | Admitting: Family Medicine

## 2013-02-12 VITALS — BP 122/76 | HR 81 | Temp 98.2°F | Ht 66.0 in | Wt 144.2 lb

## 2013-02-12 DIAGNOSIS — M545 Low back pain, unspecified: Secondary | ICD-10-CM

## 2013-02-12 MED ORDER — HYDROCODONE-ACETAMINOPHEN 5-325 MG PO TABS: 1.0000 | ORAL_TABLET | Freq: Four times a day (QID) | ORAL | Status: DC | PRN

## 2013-02-12 MED ORDER — PREDNISONE 20 MG PO TABS: ORAL_TABLET | ORAL | Status: DC

## 2013-02-12 MED ORDER — CYCLOBENZAPRINE HCL 10 MG PO TABS: 10.0000 mg | ORAL_TABLET | Freq: Every day | ORAL | Status: DC

## 2013-02-12 NOTE — Progress Notes (Signed)
Patient Name: Seth Bates Date of Birth: [birth year] Medical Record Number: [id number] Gender: male  PCP: Loura Pardon, MD  History of Present Illness:  Seth Bates is a 33 y.o. very pleasant male patient who presents with the following: Back Pain  ongoing for approximately: 48 hours The patient has had back pain before. The back pain is localized into the lumbar spine area. They also describe no radiculopathy.  Doing some yard work yesterday and then moving a lot of heavy stuff and then fell to the ground.  H/o 3 messed up disk in back - MRI not available for my review.  Chiropractor - MRI of lumbar. Was seeing [facility].    No numbness or tingling. No bowel or bladder incontinence. No focal weakness. Prior interventions: manipulation Physical therapy: No Chiropractic manipulations: yes Acupuncture: No Osteopathic manipulation: No Heat or cold: Minimal effect  Past Medical History, Surgical History, Family History, Medications, Allergies have been reviewed and updated if relevant.  Review of Systems  GEN: No fevers, chills. Nontoxic. Primarily MSK c/o today. MSK: Detailed in the HPI GI: tolerating PO intake without difficulty Neuro: As above  Otherwise the pertinent positives of the ROS are noted above.    Physical Exam  Filed Vitals:   02/12/13 0959  BP: 122/76  Pulse: 81  Temp: 98.2 F (36.8 C)  TempSrc: Oral  Height: 5\' 6"  (1.676 m)  Weight: 144 lb 4 oz (65.431 kg)    Gen: Well-developed,well-nourished,in no acute distress; alert,appropriate and cooperative throughout examination HEENT: Normocephalic and atraumatic without obvious abnormalities.  Ears, externally no deformities Pulm: Breathing comfortably in no respiratory distress Range of motion at  the waist: Flexion, rotation and lateral bending: minimal. 30 deg flexion.  No echymosis or edema Rises to examination table with no difficulty Gait: minimally  antalgic  Inspection/Deformity: No abnormality Paraspinus T:  Mild tenderness l2-s1  B Ankle Dorsiflexion (L5,4): 5/5 B Great Toe Dorsiflexion (L5,4): 5/5 Heel Walk (L5): WNL Toe Walk (S1): WNL Rise/Squat (L4): WNL, mild pain  SENSORY B Medial Foot (L4): WNL B Dorsum (L5): WNL B Lateral (S1): WNL Light Touch: WNL Pinprick: WNL  REFLEXES Knee (L4): 2+ Ankle (S1): 2+  B SLR, seated: back pain B SLR, supine: back pain B FABER: neg B Reverse FABER: neg B Greater Troch: NT B Log Roll: neg B Stork: NT B Sciatic Notch: NT   Assessment and Plan:  Back pain  I reviewed with the patient the structures involved and how they related to diagnosis.   Conservative algorithms for acute back pain generally begin with the following: NSAIDS - i am going to use steroids in this severe case. Muscle Relaxants Mild pain medication  oow recheck 2 weeks  Start with medications, core rehab, and progress from there following low back pain algorithm. No red flags are present.  Orders Placed This Encounter  Procedures  . DG Lumbar Spine Complete    Patient Instructions  F/u with me in 2 weeks.  Back pain:  Suggest moist heat: like a low level heating pad You can also use a water bottle Or a warm moist towel  A warm bath can often help Or a warm shower.  Massage is very helpful with back pain associated with muscle pain. If you have someone who lives with you who could give you a back massage, it would be a good idea.      Meds ordered this encounter  Medications  . predniSONE (DELTASONE) 20 MG tablet  Sig: 2 tabs for 5 days, then 1 tab for 5 days    Dispense:  15 tablet    Refill:  0  . HYDROcodone-acetaminophen (NORCO/VICODIN) 5-325 MG per tablet    Sig: Take 1 tablet by mouth every 6 (six) hours as needed for moderate pain.    Dispense:  40 tablet    Refill:  0  . cyclobenzaprine (FLEXERIL) 10 MG tablet    Sig: Take 1 tablet (10 mg total) by mouth at bedtime.     Dispense:  30 tablet    Refill:  1    Patient Instructions:  Signed,  Frederico Hamman T. Apolonio Cutting, MD, [place] [facility] [place] [address] Phone: [phone] Fax: [phone]

## 2013-02-12 NOTE — Patient Instructions (Signed)
F/u with me in 2 weeks.  Back pain:  Suggest moist heat: like a low level heating pad You can also use a water bottle Or a warm moist towel  A warm bath can often help Or a warm shower.  Massage is very helpful with back pain associated with muscle pain. If you have someone who lives with you who could give you a back massage, it would be a good idea.

## 2013-02-12 NOTE — Progress Notes (Signed)
Pre-visit discussion using our clinic review tool. No additional management support is needed unless otherwise documented below in the visit note.  

## 2013-02-16 ENCOUNTER — Telehealth: Payer: Self-pay

## 2013-02-16 NOTE — Telephone Encounter (Signed)
Pt request letter to return to work on 02/20/13; pt back pain is better, now pain level in back is 0. Pt still cannot bend over certain way without having pain, but overall feels better. Pt request cb.

## 2013-02-16 NOTE — Telephone Encounter (Signed)
Ok, we discussed this in the office.   Can you help write a work note, return to work 02/20/2013 and stamp my name?  Thanks.

## 2013-02-16 NOTE — Telephone Encounter (Signed)
Jermy notified letter to return to work is ready to be picked up at front desk.

## 2013-02-26 ENCOUNTER — Ambulatory Visit: Payer: BC Managed Care – PPO | Admitting: Family Medicine

## 2013-04-16 ENCOUNTER — Telehealth: Payer: Self-pay | Admitting: Family Medicine

## 2013-04-16 NOTE — Telephone Encounter (Signed)
Will see him then 

## 2013-04-16 NOTE — Telephone Encounter (Signed)
Patient Information:  Caller Name: Judson Roch  Phone: [phone]  Patient: Seth, Bates  Gender: Male  DOB: [birth year]  Age: 33 Years  PCP: [facility], [facility], [facility])  Office Follow Up:  Does the office need to follow up with this patient?: No  Instructions For The Office: N/A   Symptoms  Reason For Call & Symptoms:  Ear pain for past week. No other sx. Hx of needing irrigation for wax build up in the past- last year.  Reviewed Health History In EMR: Yes  Reviewed Medications In EMR: Yes  Reviewed Allergies In EMR: Yes  Reviewed Surgeries / Procedures: Yes  Date of Onset of Symptoms: 04/03/2013  Guideline(s) Used:  Earwax  Earache  Disposition Per Guideline:   See Today in Office  Reason For Disposition Reached:   All other earaches (Exceptions: earache lasting < 1 hour, and earache from air travel)  Advice Given:  Contagiousness:  Ear infections are not contagious.  Call Back If  Earache last more than 1 hour  High fever, severe headache, or stiff neck occurs  You become worse.  Patient Will Follow Care Advice: Prefers appointment on 04/17/13 rather than being seen at other office today    Appointment Scheduled:  04/17/2013 08:30:00 Appointment Scheduled Provider:  Loura Pardon [facility])

## 2013-04-17 ENCOUNTER — Encounter: Payer: Self-pay | Admitting: Family Medicine

## 2013-04-17 ENCOUNTER — Ambulatory Visit (INDEPENDENT_AMBULATORY_CARE_PROVIDER_SITE_OTHER): Payer: BC Managed Care – PPO | Admitting: Family Medicine

## 2013-04-17 ENCOUNTER — Telehealth: Payer: Self-pay | Admitting: Family Medicine

## 2013-04-17 VITALS — BP 136/82 | HR 70 | Temp 98.4°F | Ht 66.0 in | Wt 146.2 lb

## 2013-04-17 DIAGNOSIS — J019 Acute sinusitis, unspecified: Secondary | ICD-10-CM | POA: Insufficient documentation

## 2013-04-17 DIAGNOSIS — F17200 Nicotine dependence, unspecified, uncomplicated: Secondary | ICD-10-CM

## 2013-04-17 DIAGNOSIS — J309 Allergic rhinitis, unspecified: Secondary | ICD-10-CM

## 2013-04-17 DIAGNOSIS — B9689 Other specified bacterial agents as the cause of diseases classified elsewhere: Secondary | ICD-10-CM | POA: Insufficient documentation

## 2013-04-17 MED ORDER — MOMETASONE FUROATE 50 MCG/ACT NA SUSP: 2.0000 | Freq: Every day | NASAL | Status: DC

## 2013-04-17 MED ORDER — AMOXICILLIN-POT CLAVULANATE 875-125 MG PO TABS: 1.0000 | ORAL_TABLET | Freq: Two times a day (BID) | ORAL | Status: DC

## 2013-04-17 NOTE — Patient Instructions (Signed)
Use your nasonex for sinus congestion and ear symptoms  Take the augmentin as directed for sinus infection Keep thinking about quitting smoking  Update if not starting to improve in a week or if worsening     Smoking Cessation, Tips for Success If you are ready to quit smoking, congratulations! You have chosen to help yourself be healthier. Cigarettes bring nicotine, tar, carbon monoxide, and other irritants into your body. Your lungs, heart, and blood vessels will be able to work better without these poisons. There are many different ways to quit smoking. Nicotine gum, nicotine patches, a nicotine inhaler, or nicotine nasal spray can help with physical craving. Hypnosis, support groups, and medicines help break the habit of smoking. WHAT THINGS CAN I DO TO MAKE QUITTING EASIER?  Here are some tips to help you quit for good:  Pick a date when you will quit smoking completely. Tell all of your friends and family about your plan to quit on that date.  Do not try to slowly cut down on the number of cigarettes you are smoking. Pick a quit date and quit smoking completely starting on that day.  Throw away all cigarettes.   Clean and remove all ashtrays from your home, work, and car.   On a card, write down your reasons for quitting. Carry the card with you and read it when you get the urge to smoke.   Cleanse your body of nicotine. Drink enough water and fluids to keep your urine clear or pale yellow. Do this after quitting to flush the nicotine from your body.   Learn to predict your moods. Do not let a bad situation be your excuse to have a cigarette. Some situations in your life might tempt you into wanting a cigarette.   Never have "just one" cigarette. It leads to wanting another and another. Remind yourself of your decision to quit.   Change habits associated with smoking. If you smoked while driving or when feeling stressed, try other activities to replace smoking. Stand up when  drinking your coffee. Brush your teeth after eating. Sit in a different chair when you read the paper. Avoid alcohol while trying to quit, and try to drink fewer caffeinated beverages. Alcohol and caffeine may urge you to smoke.   Avoid foods and drinks that can trigger a desire to smoke, such as sugary or spicy foods and alcohol.   Ask people who smoke not to smoke around you.   Have something planned to do right after eating or having a cup of coffee. For example, plan to take a walk or exercise.   Try a relaxation exercise to calm you down and decrease your stress. Remember, you may be tense and nervous for the first 2 weeks after you quit, but this will pass.   Find new activities to keep your hands busy. Play with a pen, coin, or rubber band. Doodle or draw things on paper.   Brush your teeth right after eating. This will help cut down on the craving for the taste of tobacco after meals. You can also try mouthwash.   Use oral substitutes in place of cigarettes. Try using lemon drops, carrots, cinnamon sticks, or chewing gum. Keep them handy so they are available when you have the urge to smoke.   When you have the urge to smoke, try deep breathing.   Designate your home as a nonsmoking area.   If you are a heavy smoker, ask your health care provider about a  prescription for nicotine chewing gum. It can ease your withdrawal from nicotine.   Reward yourself. Set aside the cigarette money you save and buy yourself something nice.   Look for support from others. Join a support group or smoking cessation program. Ask someone at home or at work to help you with your plan to quit smoking.   Always ask yourself, "Do I need this cigarette or is this just a reflex?" Tell yourself, "Today, I choose not to smoke," or "I do not want to smoke." You are reminding yourself of your decision to quit.  Do not replace cigarette smoking with electronic cigarettes (commonly called  e-cigarettes). The safety of e-cigarettes is unknown, and some may contain harmful chemicals.  If you relapse, do not give up! Plan ahead and think about what you will do the next time you get the urge to smoke.  HOW WILL I FEEL WHEN I QUIT SMOKING? You may have symptoms of withdrawal because your body is used to nicotine (the addictive substance in cigarettes). You may crave cigarettes, be irritable, feel very hungry, cough often, get headaches, or have difficulty concentrating. The withdrawal symptoms are only temporary. They are strongest when you first quit but will go away within 10 14 days. When withdrawal symptoms occur, stay in control. Think about your reasons for quitting. Remind yourself that these are signs that your body is healing and getting used to being without cigarettes. Remember that withdrawal symptoms are easier to treat than the major diseases that smoking can cause.  Even after the withdrawal is over, expect periodic urges to smoke. However, these cravings are generally short lived and will go away whether you smoke or not. Do not smoke!  WHAT RESOURCES ARE AVAILABLE TO HELP ME QUIT SMOKING? Your health care provider can direct you to community resources or hospitals for support, which may include:  Group support.  Education.  Hypnosis.  Therapy. Document Released: 10/17/2003 Document Revised: 11/08/2012 Document Reviewed: 07/06/2012 [facility] Patient Information 2014 [place], [place].

## 2013-04-17 NOTE — Progress Notes (Signed)
Pre visit review using our clinic review tool, if applicable. No additional management support is needed unless otherwise documented below in the visit note. 

## 2013-04-17 NOTE — Telephone Encounter (Signed)
Relevant patient education mailed to patient.  

## 2013-04-17 NOTE — Progress Notes (Signed)
Subjective:    Patient ID: Seth Bates, male    DOB: 11/08/1980, 33 y.o.   MRN: [id number]  HPI Here with L ear discomfort - more pressure than pain but a bit of soreness Some trouble hearing out of that side  Other ear - did the same thing for a bit   Sinuses have been bothering him for a few mo  Stopped up  Green d/c  Some sinus pain and pressure  Some cough No wheezing   No change in smoking  He knows he needs to quit  About 1/2 ppd  Has tried vapor cig in the past   Patient Active Problem List   Diagnosis Date Noted  . Vertigo 02/07/2012  . Allergic rhinitis   . Enteritis 10/21/2011  . 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 TOBACCO ABUSE 04/23/2008   Past Medical History  Diagnosis Date  . Allergic rhinitis    No past surgical history on file. History  Substance Use Topics  . Smoking status: Current Every Day Smoker -- 0.50 packs/day    Types: Cigarettes  . Smokeless tobacco: Never Used  . Alcohol Use: No   No family history on file. No Known Allergies Current Outpatient Prescriptions on File Prior to Visit  Medication Sig Dispense Refill  . acetaminophen (TYLENOL) 500 MG tablet Take 500 mg by mouth every 6 (six) hours as needed.        . cyclobenzaprine (FLEXERIL) 10 MG tablet Take 1 tablet (10 mg total) by mouth at bedtime.  30 tablet  1  . HYDROcodone-acetaminophen (NORCO/VICODIN) 5-325 MG per tablet Take 1 tablet by mouth every 6 (six) hours as needed for moderate pain.  40 tablet  0  . loratadine (CLARITIN) 10 MG tablet Take 10 mg by mouth daily.      . mometasone (NASONEX) 50 MCG/ACT nasal spray Place 2 sprays into the nose daily.  17 g  3  . naproxen sodium (ANAPROX) 220 MG tablet Take 220 mg by mouth as needed.      . predniSONE (DELTASONE) 20 MG tablet 2 tabs for 5 days, then 1 tab for 5 days  15 tablet  0   No current facility-administered medications on file prior to visit.    Review of Systems Review of Systems  Constitutional:  Negative for fever, appetite change, and unexpected weight change.  ENt pos for congestion/rhinorrhea/facial pain and pressure and ear pain/ neg for ST Eyes: Negative for pain and visual disturbance.  Respiratory: Negative for wheeze and shortness of breath.   Cardiovascular: Negative for cp or palpitations    Gastrointestinal: Negative for nausea, diarrhea and constipation.  Genitourinary: Negative for urgency and frequency.  Skin: Negative for pallor or rash   Neurological: Negative for weakness, light-headedness, numbness and headaches.  Hematological: Negative for adenopathy. Does not bruise/bleed easily.  Psychiatric/Behavioral: Negative for dysphoric mood. The patient is not nervous/anxious.         Objective:   Physical Exam  Constitutional: He appears well-developed and well-nourished. No distress.  HENT:  Head: Normocephalic and atraumatic.  Mouth/Throat: No oropharyngeal exudate.  Nares are injected and congested  bilat maxillary sinus tenderness worse on L  TMs are dull  R TM is retracted slightly    Eyes: Conjunctivae and EOM are normal. Pupils are equal, round, and reactive to light. Right eye exhibits no discharge. Left eye exhibits no discharge.  Neck: Normal range of motion. Neck supple.  Cardiovascular: Normal rate and regular rhythm.   Pulmonary/Chest:  Effort normal and breath sounds normal. No respiratory distress. He has no wheezes. He has no rales.  Lymphadenopathy:    He has no cervical adenopathy.  Neurological: He is alert. No cranial nerve deficit.  Skin: Skin is warm and dry. No rash noted. No erythema.  Psychiatric: He has a normal mood and affect.          Assessment & Plan:

## 2013-04-18 NOTE — Assessment & Plan Note (Signed)
Disc in detail risks of smoking and possible outcomes including copd, vascular/ heart disease, cancer , respiratory and sinus infections  Pt voices understanding  Pt not ready to quit yet  When he is - he plans to see if a friend will quit with him and may try a vapor cig again

## 2013-04-18 NOTE — Assessment & Plan Note (Signed)
Cover with augmentin Enc use of nasonex for this and ETD and allergies Disc symptomatic care - see instructions on AVS  Update if not starting to improve in a week or if worsening

## 2013-04-18 NOTE — Assessment & Plan Note (Signed)
Enc use of nasonex every day through allergy season to prevent con and further sinus infx

## 2013-07-17 ENCOUNTER — Encounter: Payer: Self-pay | Admitting: Family Medicine

## 2013-07-17 ENCOUNTER — Ambulatory Visit (INDEPENDENT_AMBULATORY_CARE_PROVIDER_SITE_OTHER): Payer: BC Managed Care – PPO | Admitting: Family Medicine

## 2013-07-17 VITALS — BP 126/68 | HR 72 | Temp 98.2°F | Wt 143.5 lb

## 2013-07-17 DIAGNOSIS — H6091 Unspecified otitis externa, right ear: Secondary | ICD-10-CM

## 2013-07-17 DIAGNOSIS — H60399 Other infective otitis externa, unspecified ear: Secondary | ICD-10-CM

## 2013-07-17 MED ORDER — NEOMYCIN-POLYMYXIN-HC 1 % OT SOLN: 3.0000 [drp] | Freq: Four times a day (QID) | OTIC | Status: DC

## 2013-07-17 NOTE — Progress Notes (Signed)
Pre visit review using our clinic review tool, if applicable. No additional management support is needed unless otherwise documented below in the visit note.  A few days ago he had sensation of fluid in his R ear, noted with chewing.  Now with pain in R ear.  No L ear pain now, a little discomfort earlier.  No FCNAVD.  No rhinorrhea, no ST; scant cough but dust exposure noted.    Meds, vitals, and allergies reviewed.   ROS: See HPI.  Otherwise, noncontributory.  nad ncat Mmm Nasal and OP exam wnl TMs wnl L canal wnl, R canal red and irritated. R mastoid not ttp Neck supple, no LA

## 2013-07-17 NOTE — Patient Instructions (Signed)
3 drops into the right ear 4 (four) times daily for a few days.  Leave in for about 5 minutes, laying on your side.  This should get better soon.  Take care.

## 2013-07-18 DIAGNOSIS — H6091 Unspecified otitis externa, right ear: Secondary | ICD-10-CM | POA: Insufficient documentation

## 2013-07-18 NOTE — Assessment & Plan Note (Signed)
Routine instructions given for drops and f/u prn.  Should resolve.

## 2013-08-13 ENCOUNTER — Ambulatory Visit (INDEPENDENT_AMBULATORY_CARE_PROVIDER_SITE_OTHER): Payer: BC Managed Care – PPO | Admitting: Family Medicine

## 2013-08-13 ENCOUNTER — Encounter: Payer: Self-pay | Admitting: Family Medicine

## 2013-08-13 VITALS — BP 114/74 | HR 81 | Temp 98.5°F | Ht 66.0 in | Wt 143.5 lb

## 2013-08-13 DIAGNOSIS — Z9189 Other specified personal risk factors, not elsewhere classified: Secondary | ICD-10-CM | POA: Insufficient documentation

## 2013-08-13 DIAGNOSIS — Z789 Other specified health status: Secondary | ICD-10-CM

## 2013-08-13 MED ORDER — NEOMYCIN-POLYMYXIN-HC 3.5-10000-1 OP SUSP: 3.0000 [drp] | Freq: Three times a day (TID) | OPHTHALMIC | Status: DC

## 2013-08-13 NOTE — Progress Notes (Signed)
Pre visit review using our clinic review tool, if applicable. No additional management support is needed unless otherwise documented below in the visit note. 

## 2013-08-13 NOTE — Progress Notes (Signed)
Subjective:    Patient ID: Seth Bates, male    DOB: 1980-08-20, 33 y.o.   MRN: [id number]  HPI On Thursday he had a bug fly in his eye - and he got it out  It burned and stung a bit (he does not think he was bitten however) ? Type of bug Red and eye aches since that side  A little drainage/ mucous/ drainage - since then  No big vision changes   He does not wear contact lenses  No uri lately   Patient Active Problem List   Diagnosis Date Noted  . Right otitis externa 07/18/2013  . Acute bacterial sinusitis 04/17/2013  . Vertigo 02/07/2012  . Allergic rhinitis   . Enteritis 10/21/2011  . 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 TOBACCO ABUSE 04/23/2008   Past Medical History  Diagnosis Date  . Allergic rhinitis    No past surgical history on file. History  Substance Use Topics  . Smoking status: Current Every Day Smoker -- 0.50 packs/day    Types: Cigarettes  . Smokeless tobacco: Never Used  . Alcohol Use: No   No family history on file. No Known Allergies Current Outpatient Prescriptions on File Prior to Visit  Medication Sig Dispense Refill  . loratadine (CLARITIN) 10 MG tablet Take 10 mg by mouth daily.      . mometasone (NASONEX) 50 MCG/ACT nasal spray Place 2 sprays into the nose daily.  17 g  11  . naproxen sodium (ANAPROX) 220 MG tablet Take 220 mg by mouth as needed.       No current facility-administered medications on file prior to visit.      Review of Systems Review of Systems  Constitutional: Negative for fever, appetite change, fatigue and unexpected weight change.  Eyes: Negative for vision disturbance , pos for R eye erythema and irritation Respiratory: Negative for cough and shortness of breath.   Cardiovascular: Negative for cp or palpitations    Gastrointestinal: Negative for nausea, diarrhea and constipation.  Genitourinary: Negative for urgency and frequency.  Skin: Negative for pallor or rash   Neurological: Negative for  weakness, light-headedness, numbness and headaches.  Hematological: Negative for adenopathy. Does not bruise/bleed easily.  Psychiatric/Behavioral: Negative for dysphoric mood. The patient is not nervous/anxious.         Objective:   Physical Exam  Constitutional: He appears well-developed and well-nourished. No distress.  HENT:  Head: Normocephalic and atraumatic.  Mouth/Throat: Oropharynx is clear and moist.  Eyes: EOM are normal. Pupils are equal, round, and reactive to light. Left eye exhibits no discharge. No scleral icterus.  R eye-some mild conj injection -more medially No fb seen  No conj hemorrhage  Scant clear drainage No lid or facial swelling   Neck: Normal range of motion. Neck supple.  Cardiovascular: Normal rate and regular rhythm.   Pulmonary/Chest: Effort normal and breath sounds normal.  Lymphadenopathy:    He has no cervical adenopathy.  Neurological: He is alert.  Skin: Skin is warm. No rash noted. No erythema.  Psychiatric: He has a normal mood and affect.          Assessment & Plan:   Problem List Items Addressed This Visit     Other   Potential for corneal abrasion - Primary     After a bug flew in it  Reassuring exam today except for some mild conjunctival erythema  Will tx with cortisporin opthy susp Update if not starting to improve in a week or  if worsening  -esp if vision change

## 2013-08-13 NOTE — Patient Instructions (Signed)
I think you may have had an abrasion to the eye from the bug that flew in it  Use the drops as directed until your symptoms resolve Update if not starting to improve in a week or if worsening  - esp if vision change or increased redness or swelling Protect your eye

## 2013-08-13 NOTE — Assessment & Plan Note (Signed)
After a bug flew in it  Reassuring exam today except for some mild conjunctival erythema  Will tx with cortisporin opthy susp Update if not starting to improve in a week or if worsening  -esp if vision change

## 2014-02-05 ENCOUNTER — Ambulatory Visit (INDEPENDENT_AMBULATORY_CARE_PROVIDER_SITE_OTHER): Payer: BC Managed Care – PPO | Admitting: Family Medicine

## 2014-02-05 ENCOUNTER — Encounter: Payer: Self-pay | Admitting: Family Medicine

## 2014-02-05 VITALS — BP 126/64 | HR 81 | Temp 98.7°F | Ht 66.0 in | Wt 143.8 lb

## 2014-02-05 DIAGNOSIS — R197 Diarrhea, unspecified: Secondary | ICD-10-CM | POA: Insufficient documentation

## 2014-02-05 DIAGNOSIS — R109 Unspecified abdominal pain: Secondary | ICD-10-CM | POA: Insufficient documentation

## 2014-02-05 LAB — COMPREHENSIVE METABOLIC PANEL
ALT: 31 U/L (ref 0–53)
AST: 19 U/L (ref 0–37)
Albumin: 4.7 g/dL (ref 3.5–5.2)
Alkaline Phosphatase: 65 U/L (ref 39–117)
BUN: 12 mg/dL (ref 6–23)
CO2: 25 mEq/L (ref 19–32)
Calcium: 9.7 mg/dL (ref 8.4–10.5)
Chloride: 104 mEq/L (ref 96–112)
Creatinine, Ser: 0.9 mg/dL (ref 0.4–1.5)
GFR: 104.11 mL/min (ref >60.00)
Glucose, Bld: 96 mg/dL (ref 70–99)
Potassium: 4.1 mEq/L (ref 3.5–5.1)
Sodium: 139 mEq/L (ref 135–145)
Total Bilirubin: 1 mg/dL (ref 0.2–1.2)
Total Protein: 7.5 g/dL (ref 6.0–8.3)

## 2014-02-05 LAB — CBC WITH DIFFERENTIAL/PLATELET
BASOS ABS: 0 10*3/uL (ref 0.0–0.1)
Basophils Relative: 0.2 % (ref 0.0–3.0)
Eosinophils Absolute: 0 10*3/uL (ref 0.0–0.7)
Eosinophils Relative: 0.1 % (ref 0.0–5.0)
HEMATOCRIT: 52.8 % — AB (ref 39.0–52.0)
Hemoglobin: 17.7 g/dL — ABNORMAL HIGH (ref 13.0–17.0)
Lymphocytes Relative: 25.7 % (ref 12.0–46.0)
Lymphs Abs: 2.3 10*3/uL (ref 0.7–4.0)
MCHC: 33.5 g/dL (ref 30.0–36.0)
MCV: 88.9 fl (ref 78.0–100.0)
Monocytes Absolute: 0.7 10*3/uL (ref 0.1–1.0)
Monocytes Relative: 7.4 % (ref 3.0–12.0)
NEUTROS ABS: 6 10*3/uL (ref 1.4–7.7)
Neutrophils Relative %: 66.6 % (ref 43.0–77.0)
PLATELETS: 347 10*3/uL (ref 150.0–400.0)
RBC: 5.94 Mil/uL — ABNORMAL HIGH (ref 4.22–5.81)
RDW: 12.7 % (ref 11.5–15.5)
WBC: 9.1 10*3/uL (ref 4.0–10.5)

## 2014-02-05 LAB — TSH: TSH: 0.9 u[IU]/mL (ref 0.35–4.50)

## 2014-02-05 MED ORDER — HYOSCYAMINE SULFATE ER 0.375 MG PO TB12: 0.3750 mg | ORAL_TABLET | Freq: Two times a day (BID) | ORAL | Status: DC | PRN

## 2014-02-05 NOTE — Patient Instructions (Signed)
Get citrucel fiber supplement - take it once daily as directed  levbid - helps cramps if you need it  Labs and stool studies today   Then we will make a plan based on results

## 2014-02-05 NOTE — Progress Notes (Signed)
Pre visit review using our clinic review tool, if applicable. No additional management support is needed unless otherwise documented below in the visit note. 

## 2014-02-05 NOTE — Assessment & Plan Note (Signed)
Frequent small stools with abd cramping  Diff incl ibs/ infx colitis/ auto immune/diverticulitis or other  Lab today Stool test for c diff and also stool cx   Px levbid prn for cramps  Adv to use fiber daily  Stressed imp of hydration  May ref to GI based on results

## 2014-02-05 NOTE — Progress Notes (Signed)
Subjective:    Patient ID: Seth Bates, male    DOB: November 26, 1980, 34 y.o.   MRN: [id number]  HPI Here for stomach problems   1-2 months  Has BM 2-3 or more , somewhat loose, no blood or dark color   (and just a little bit comes out-- then has to go quickly again) Cramping with those  No vomiting but does get nausea   No heartburn or gastric/upper GI symptoms   " I have been in here before for this"- it usually gets better  No change in diet No change in stress Does not think he has had food poisoning   No fam hx of colon problems or cancer   Diet is pretty good - does eat fruits and vegetables   Wt is stable   Has not been on abx recently   Patient Active Problem List   Diagnosis Date Noted  . Potential for corneal abrasion 08/13/2013  . Right otitis externa 07/18/2013  . Acute bacterial sinusitis 04/17/2013  . Vertigo 02/07/2012  . Allergic rhinitis   . Enteritis 10/21/2011  . 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 TOBACCO ABUSE 04/23/2008   Past Medical History  Diagnosis Date  . Allergic rhinitis    No past surgical history on file. History  Substance Use Topics  . Smoking status: Current Every Day Smoker -- 0.50 packs/day    Types: Cigarettes  . Smokeless tobacco: Never Used  . Alcohol Use: No   No family history on file. No Known Allergies Current Outpatient Prescriptions on File Prior to Visit  Medication Sig Dispense Refill  . loratadine (CLARITIN) 10 MG tablet Take 10 mg by mouth daily.    . mometasone (NASONEX) 50 MCG/ACT nasal spray Place 2 sprays into the nose daily. 17 g 11  . naproxen sodium (ANAPROX) 220 MG tablet Take 220 mg by mouth as needed.     No current facility-administered medications on file prior to visit.     Review of Systems Review of Systems  Constitutional: Negative for fever, appetite change, fatigue and unexpected weight change.  Eyes: Negative for pain and visual disturbance.  Respiratory: Negative for  cough and shortness of breath.   Cardiovascular: Negative for cp or palpitations    Gastrointestinal: Negative for nausea, vomiting/ constipation/ blood in stool or dark stool  Genitourinary: Negative for urgency and frequency.  Skin: Negative for pallor or rash   Neurological: Negative for weakness, light-headedness, numbness and headaches.  Hematological: Negative for adenopathy. Does not bruise/bleed easily.  Psychiatric/Behavioral: Negative for dysphoric mood. The patient is not nervous/anxious.         Objective:   Physical Exam  Constitutional: He appears well-developed and well-nourished. No distress.  Slim and well appearing   HENT:  Head: Normocephalic and atraumatic.  Mouth/Throat: Oropharynx is clear and moist.  Eyes: Conjunctivae and EOM are normal. Pupils are equal, round, and reactive to light. No scleral icterus.  Neck: Normal range of motion. Neck supple. No thyromegaly present.  Cardiovascular: Normal rate, regular rhythm and normal heart sounds.   Pulmonary/Chest: Effort normal and breath sounds normal. No respiratory distress. He has no wheezes. He has no rales.  Abdominal: Soft. Bowel sounds are normal. He exhibits no distension, no abdominal bruit, no ascites, no pulsatile midline mass and no mass. There is no hepatosplenomegaly. There is tenderness in the right lower quadrant and left lower quadrant. There is no rigidity, no rebound, no guarding, no CVA tenderness, no tenderness at McBurney's point  and negative Murphy's sign. No hernia.  Musculoskeletal: He exhibits no edema or tenderness.  Lymphadenopathy:    He has no cervical adenopathy.  Neurological: He is alert. He has normal reflexes. No cranial nerve deficit. He exhibits normal muscle tone. Coordination normal.  Skin: Skin is warm and dry. No rash noted. No erythema. No pallor.  Nl skin color and turgor   Psychiatric: He has a normal mood and affect.          Assessment & Plan:   Problem List Items  Addressed This Visit      Other   Abdominal cramping   Relevant Orders      CBC with Differential (Completed)      Comprehensive metabolic panel (Completed)      TSH (Completed)      C. difficile GDH and Toxin A/B (Completed)      Stool culture   Diarrhea - Primary    Frequent small stools with abd cramping  Diff incl ibs/ infx colitis/ auto immune/diverticulitis or other  Lab today Stool test for c diff and also stool cx   Px levbid prn for cramps  Adv to use fiber daily  Stressed imp of hydration  May ref to GI based on results    Relevant Orders      CBC with Differential (Completed)      Comprehensive metabolic panel (Completed)      TSH (Completed)      C. difficile GDH and Toxin A/B (Completed)      Stool culture

## 2014-02-06 LAB — C. DIFFICILE GDH AND TOXIN A/B
C. DIFFICILE GDH: NOT DETECTED
C. difficile Toxin A/B: NOT DETECTED
Source: 0

## 2014-02-09 LAB — STOOL CULTURE

## 2014-07-09 ENCOUNTER — Encounter: Payer: Self-pay | Admitting: Physician Assistant

## 2014-07-09 ENCOUNTER — Encounter: Payer: Self-pay | Admitting: Family Medicine

## 2014-07-09 ENCOUNTER — Ambulatory Visit (INDEPENDENT_AMBULATORY_CARE_PROVIDER_SITE_OTHER): Payer: BC Managed Care – PPO | Admitting: Family Medicine

## 2014-07-09 VITALS — BP 122/78 | HR 71 | Temp 98.5°F | Ht 66.0 in | Wt 142.0 lb

## 2014-07-09 DIAGNOSIS — K2970 Gastritis, unspecified, without bleeding: Secondary | ICD-10-CM | POA: Diagnosis not present

## 2014-07-09 DIAGNOSIS — R197 Diarrhea, unspecified: Secondary | ICD-10-CM

## 2014-07-09 MED ORDER — RANITIDINE HCL 150 MG PO CAPS: 150.0000 mg | ORAL_CAPSULE | Freq: Two times a day (BID) | ORAL | Status: DC

## 2014-07-09 NOTE — Assessment & Plan Note (Signed)
This is ongoing with some imp in cramping with citrucel Rev lab from 1/16  Disc poss of IBS Still need to r/o other colon probs or colitis Ref to GI for further eval

## 2014-07-09 NOTE — Assessment & Plan Note (Signed)
Pt has ongoing epigastric pain and early satiety for 2 wk  Trial of zantac Also ref to GI for this and diarrhea  Rev diet-handout given as well Enc to quit smoking and avoid caffeine

## 2014-07-09 NOTE — Progress Notes (Signed)
Subjective:    Patient ID: Seth Bates, male    DOB: [birth year], 34 y.o.   MRN: [id number]  HPI Here with GI issues  Still "has to go to the bathroom a lot" in the mornings Somewhat loose stool  -not severely (and it calms down around noon) Usually 3-4 BM before then  No blood in stool  No dark stool  Cramping is less frequent (took levsin a few times-helped a bit)  Now for the past few weeks he has "a stomach ache"- middle to right upper abdomen Feels like an ache (does not feel like hunger pains) Not burning No weight change Feels full and bloated  Gets full quickly Has felt like "food wanted to come back up"-but did not vomit No heartburn   He stopped eating some foods that he thought made him worse Greasy foods  Ranch dressing and milk and mayo (did not really help)   No alcohol  Still smokes 3-5 cig per day  No recent stress    Last visit - did labs 1/16 Office Visit on 02/05/2014  Component Date Value Ref Range Status  . WBC 02/05/2014 9.1  4.0 - 10.5 K/uL Final  . RBC 02/05/2014 5.94* 4.22 - 5.81 Mil/uL Final  . Hemoglobin 02/05/2014 17.7* 13.0 - 17.0 g/dL Final  . HCT 02/05/2014 52.8* 39.0 - 52.0 % Final  . MCV 02/05/2014 88.9  78.0 - 100.0 fl Final  . MCHC 02/05/2014 33.5  30.0 - 36.0 g/dL Final  . RDW 02/05/2014 12.7  11.5 - 15.5 % Final  . Platelets 02/05/2014 347.0  150.0 - 400.0 K/uL Final  . Neutrophils Relative % 02/05/2014 66.6  43.0 - 77.0 % Final  . Lymphocytes Relative 02/05/2014 25.7  12.0 - 46.0 % Final  . Monocytes Relative 02/05/2014 7.4  3.0 - 12.0 % Final  . Eosinophils Relative 02/05/2014 0.1  0.0 - 5.0 % Final  . Basophils Relative 02/05/2014 0.2  0.0 - 3.0 % Final  . Neutro Abs 02/05/2014 6.0  1.4 - 7.7 K/uL Final  . Lymphs Abs 02/05/2014 2.3  0.7 - 4.0 K/uL Final  . Monocytes Absolute 02/05/2014 0.7  0.1 - 1.0 K/uL Final  . Eosinophils Absolute 02/05/2014 0.0  0.0 - 0.7 K/uL Final  . Basophils Absolute 02/05/2014 0.0  0.0 -  0.1 K/uL Final  . Sodium 02/05/2014 139  135 - 145 mEq/L Final  . Potassium 02/05/2014 4.1  3.5 - 5.1 mEq/L Final  . Chloride 02/05/2014 104  96 - 112 mEq/L Final  . CO2 02/05/2014 25  19 - 32 mEq/L Final  . Glucose, Bld 02/05/2014 96  70 - 99 mg/dL Final  . BUN 02/05/2014 12  6 - 23 mg/dL Final  . Creatinine, Ser 02/05/2014 0.9  0.4 - 1.5 mg/dL Final  . Total Bilirubin 02/05/2014 1.0  0.2 - 1.2 mg/dL Final  . Alkaline Phosphatase 02/05/2014 65  39 - 117 U/L Final  . AST 02/05/2014 19  0 - 37 U/L Final  . ALT 02/05/2014 31  0 - 53 U/L Final  . Total Protein 02/05/2014 7.5  6.0 - 8.3 g/dL Final  . Albumin 02/05/2014 4.7  3.5 - 5.2 g/dL Final  . Calcium 02/05/2014 9.7  8.4 - 10.5 mg/dL Final  . GFR 02/05/2014 104.11  >60.00 mL/min Final  . TSH 02/05/2014 0.90  0.35 - 4.50 uIU/mL Final  . C. difficile GDH 02/05/2014 Not Detected   Final  . C. difficile Toxin A/B 02/05/2014 Not Detected  Final   No Toxigenic C. difficile Detected  . Source 02/05/2014 .   Final  . Organism ID, Bacteria 02/05/2014 No Salmonella,Shigella,Campylobacter,Yersinia,or   Final  . Organism ID, Bacteria 02/05/2014 No E.coli 0157:H7 isolated.   Final     Diarrhea is about the same - but not cramping   No family hx of GI problems that he knows of - but not entirely sure   Patient Active Problem List   Diagnosis Date Noted  . Diarrhea 02/05/2014  . Abdominal cramping 02/05/2014  . Potential for corneal abrasion 08/13/2013  . Right otitis externa 07/18/2013  . Acute bacterial sinusitis 04/17/2013  . Vertigo 02/07/2012  . Allergic rhinitis   . Enteritis 10/21/2011  . 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 TOBACCO ABUSE 04/23/2008   Past Medical History  Diagnosis Date  . Allergic rhinitis    No past surgical history on file. History  Substance Use Topics  . Smoking status: Current Every Day Smoker -- 0.25 packs/day    Types: Cigarettes  . Smokeless tobacco: Never Used  . Alcohol Use: No   No  family history on file. No Known Allergies Current Outpatient Prescriptions on File Prior to Visit  Medication Sig Dispense Refill  . hyoscyamine (LEVBID) 0.375 MG 12 hr tablet Take 1 tablet (0.375 mg total) by mouth every 12 (twelve) hours as needed for cramping. 60 tablet 0  . loratadine (CLARITIN) 10 MG tablet Take 10 mg by mouth daily.    . mometasone (NASONEX) 50 MCG/ACT nasal spray Place 2 sprays into the nose daily. 17 g 11  . naproxen sodium (ANAPROX) 220 MG tablet Take 220 mg by mouth as needed.     No current facility-administered medications on file prior to visit.      Review of Systems Review of Systems  Constitutional: Negative for fever, appetite change, fatigue and unexpected weight change.  Eyes: Negative for pain and visual disturbance.  Respiratory: Negative for cough and shortness of breath.   Cardiovascular: Negative for cp or palpitations    Gastrointestinal: Negative for vomiting/ dark stool, blood in stool  Genitourinary: Negative for urgency and frequency.  Skin: Negative for pallor or rash   Neurological: Negative for weakness, light-headedness, numbness and headaches.  Hematological: Negative for adenopathy. Does not bruise/bleed easily.  Psychiatric/Behavioral: Negative for dysphoric mood. The patient is not nervous/anxious.         Objective:   Physical Exam  Constitutional: He appears well-developed and well-nourished. No distress.  HENT:  Head: Normocephalic and atraumatic.  Mouth/Throat: Oropharynx is clear and moist.  Eyes: Conjunctivae and EOM are normal. Pupils are equal, round, and reactive to light. No scleral icterus.  Neck: Normal range of motion. Neck supple. No thyromegaly present.  Cardiovascular: Normal rate and regular rhythm.   Pulmonary/Chest: Effort normal and breath sounds normal. No respiratory distress. He has no wheezes. He has no rales.  Good air exch   Abdominal: Soft. Bowel sounds are normal. He exhibits no distension and no  mass. There is no hepatosplenomegaly. There is tenderness in the epigastric area. There is no rigidity, no rebound, no guarding, no CVA tenderness, no tenderness at McBurney's point and negative Murphy's sign.  Lymphadenopathy:    He has no cervical adenopathy.  Neurological: He is alert. He has normal reflexes.  Skin: Skin is warm and dry. No rash noted. No erythema. No pallor.  No jaundice   Psychiatric: He has a normal mood and affect.  Assessment & Plan:   Problem List Items Addressed This Visit    Diarrhea - Primary    This is ongoing with some imp in cramping with citrucel Rev lab from 1/16  Disc poss of IBS Still need to r/o other colon probs or colitis Ref to GI for further eval      Relevant Orders   Ambulatory referral to Gastroenterology   Gastritis    Pt has ongoing epigastric pain and early satiety for 2 wk  Trial of zantac Also ref to GI for this and diarrhea  Rev diet-handout given as well Enc to quit smoking and avoid caffeine       Relevant Orders   Ambulatory referral to Gastroenterology

## 2014-07-09 NOTE — Progress Notes (Signed)
Pre visit review using our clinic review tool, if applicable. No additional management support is needed unless otherwise documented below in the visit note. 

## 2014-07-09 NOTE — Patient Instructions (Signed)
Keep up a good fluid intake  Continue citrucel if it helps cramping Try the ranitidine (zantac) for higher abdominal pain that may be from acid  Think about quitting smoking Stop at check out for referral to GI

## 2014-07-18 ENCOUNTER — Encounter: Payer: Self-pay | Admitting: Physician Assistant

## 2014-07-18 ENCOUNTER — Other Ambulatory Visit (INDEPENDENT_AMBULATORY_CARE_PROVIDER_SITE_OTHER): Payer: BC Managed Care – PPO

## 2014-07-18 ENCOUNTER — Ambulatory Visit (INDEPENDENT_AMBULATORY_CARE_PROVIDER_SITE_OTHER): Payer: BC Managed Care – PPO | Admitting: Physician Assistant

## 2014-07-18 VITALS — BP 110/72 | HR 84 | Ht 65.5 in | Wt 140.1 lb

## 2014-07-18 DIAGNOSIS — R110 Nausea: Secondary | ICD-10-CM

## 2014-07-18 DIAGNOSIS — R1031 Right lower quadrant pain: Secondary | ICD-10-CM

## 2014-07-18 DIAGNOSIS — R1033 Periumbilical pain: Secondary | ICD-10-CM

## 2014-07-18 DIAGNOSIS — R194 Change in bowel habit: Secondary | ICD-10-CM

## 2014-07-18 LAB — BASIC METABOLIC PANEL
BUN: 9 mg/dL (ref 6–23)
CO2: 31 mEq/L (ref 19–32)
Calcium: 9.4 mg/dL (ref 8.4–10.5)
Chloride: 103 mEq/L (ref 96–112)
Creatinine, Ser: 0.98 mg/dL (ref 0.40–1.50)
GFR: 92.91 mL/min (ref >60.00)
Glucose, Bld: 86 mg/dL (ref 70–99)
Potassium: 3.8 mEq/L (ref 3.5–5.1)
Sodium: 138 mEq/L (ref 135–145)

## 2014-07-18 LAB — CBC WITH DIFFERENTIAL/PLATELET
BASOS PCT: 0.6 % (ref 0.0–3.0)
Basophils Absolute: 0.1 10*3/uL (ref 0.0–0.1)
EOS ABS: 0 10*3/uL (ref 0.0–0.7)
EOS PCT: 0.4 % (ref 0.0–5.0)
HCT: 52 % (ref 39.0–52.0)
Hemoglobin: 17.6 g/dL — ABNORMAL HIGH (ref 13.0–17.0)
LYMPHS PCT: 30.4 % (ref 12.0–46.0)
Lymphs Abs: 2.8 10*3/uL (ref 0.7–4.0)
MCHC: 33.8 g/dL (ref 30.0–36.0)
MCV: 88.3 fl (ref 78.0–100.0)
Monocytes Absolute: 0.8 10*3/uL (ref 0.1–1.0)
Monocytes Relative: 8.4 % (ref 3.0–12.0)
NEUTROS PCT: 60.2 % (ref 43.0–77.0)
Neutro Abs: 5.6 10*3/uL (ref 1.4–7.7)
Platelets: 325 10*3/uL (ref 150.0–400.0)
RBC: 5.89 Mil/uL — AB (ref 4.22–5.81)
RDW: 12.7 % (ref 11.5–15.5)
WBC: 9.3 10*3/uL (ref 4.0–10.5)

## 2014-07-18 LAB — SEDIMENTATION RATE: Sed Rate: 2 mm/hr (ref 0–22)

## 2014-07-18 LAB — IGA: IGA: 232 mg/dL (ref 68–378)

## 2014-07-18 LAB — HIGH SENSITIVITY CRP: CRP HIGH SENSITIVITY: 0.66 mg/L (ref 0.000–5.000)

## 2014-07-18 MED ORDER — NA SULFATE-K SULFATE-MG SULF 17.5-3.13-1.6 GM/177ML PO SOLN: ORAL | Status: DC

## 2014-07-18 NOTE — Progress Notes (Signed)
Patient ID: [name], male   DOB: 05/23/1980, 34 y.o.   MRN: [id number]   Subjective:    Patient ID: Seth Bates, male    DOB: 09/18/1980, 34 y.o.   MRN: [id number]  HPI Seth Bates is a 34 year old white male referred today by Dr. Rica Mote for GI evaluation. He has not had any prior GI workup. He has generally been in good health. He says his current symptoms started several months ago and have been persistent since. He complains of abdominal cramping and pain prior to bowel movements and with bowel movements and has been having 3-6 bowel movements per day. He says usually his stools are not diarrhea just very frequent. He has not noted any melena or hematochezia she has had some intermittent nausea without vomiting. His appetite has been fine and his weight has been stable. He does note that his abdominal pain is usually worse postprandially and says that this is in his mid abdomen.  His current symptoms started in January 2016 and he says prior to that time he had always had regular bowel movements and had no ongoing GI issues. He has not been on any new medications or antibiotics prior to onset of symptoms. Family history is negative for GI disease. He does use  NSAIDs on a fairly regular basis 2-3 per day as needed for headaches ,and has done this for years but does not take every day.  Review of Systems Pertinent positive and negative review of systems were noted in the above HPI section.  All other review of systems was otherwise negative.  Outpatient Encounter Prescriptions as of 07/18/2014  Medication Sig  . hyoscyamine (LEVBID) 0.375 MG 12 hr tablet Take 1 tablet (0.375 mg total) by mouth every 12 (twelve) hours as needed for cramping.  . Na Sulfate-K Sulfate-Mg Sulf SOLN Take as directed by mouth,  . naproxen sodium (ANAPROX) 220 MG tablet Take 220 mg by mouth as needed.  . ranitidine (ZANTAC) 150 MG capsule Take 1 capsule (150 mg total) by mouth 2 (two) times daily.    . [DISCONTINUED] loratadine (CLARITIN) 10 MG tablet Take 10 mg by mouth daily.  . [DISCONTINUED] mometasone (NASONEX) 50 MCG/ACT nasal spray Place 2 sprays into the nose daily.   No facility-administered encounter medications on file as of 07/18/2014.   No Known Allergies Patient Active Problem List   Diagnosis Date Noted  . Gastritis 07/09/2014  . Diarrhea 02/05/2014  . Abdominal cramping 02/05/2014  . Potential for corneal abrasion 08/13/2013  . Right otitis externa 07/18/2013  . Acute bacterial sinusitis 04/17/2013  . Vertigo 02/07/2012  . Allergic rhinitis   . Enteritis 10/21/2011  . 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 TOBACCO ABUSE 04/23/2008   History   Social History  . Marital Status: Single    Spouse Name: N/A  . Number of Children: 2  . Years of Education: N/A   Occupational History  . Teacher, adult education    Social History Main Topics  . Smoking status: Current Every Day Smoker -- 0.25 packs/day    Types: Cigarettes  . Smokeless tobacco: Never Used  . Alcohol Use: No  . Drug Use: No  . Sexual Activity: Not on file   Other Topics Concern  . Not on file   Social History Narrative    Mr. Colan family history is not on file.      Objective:    Filed Vitals:   07/18/14 1327  BP: 110/72  Pulse: 84  Physical Exam  well-developed no acute distress, blood pressure 110/72 pulse 84 height 5 foot 5 weight 140. HEENT; nontraumatic normocephalic EOMI PERRLA sclera anicteric, Supple; no JVD, Cardiovascular; regular rate and rhythm with S1-S2 no murmur or gallop, Pulmonary; clear bilaterally, Abdomen ;soft bowel sounds are present he is nondistended he is tender in the right mid and right lower quadrant no guarding or rebound no palpable mass or hepatosplenomegaly Rectal; exam not done, Ext; no clubbing cyanosis or edema skin warm and dry, Psych;mood and affect appropriate       Assessment & Plan:   #1 34 yo male with  6 month hx of persistent  crampy  mid abdominal  pain, and frequent Bm's 4-6 per day.  R/O IBS, R/O IBD, R/O celiac disease  Plan; baseline labs , ESR CRP, TTG, IGA Schedule for Colonoscopy with Dr Fuller Plan- procedure discussed in detail with pt and he is agreeable to proceed. Further management pending above   Maudene Stotler S Posey Jasmin PA-C 07/18/2014   Cc: Tower, Wynelle Fanny, MD

## 2014-07-18 NOTE — Patient Instructions (Addendum)
Please go to the basement level to have your labs drawn.   You have been given a separate informational sheet regarding your tobacco use, the importance of quitting and local resources to help you quit. You have been scheduled for a colonoscopy. Please follow written instructions given to you at your visit today.  Please pick up your prep supplies at the pharmacy within the next 1-3 days. [facility]. If you use inhalers (even only as needed), please bring them with you on the day of your procedure. Your physician has requested that you go to www.startemmi.com and enter the access code given to you at your visit today. This web site gives a general overview about your procedure. However, you should still follow specific instructions given to you by our office regarding your preparation for the procedure.

## 2014-07-18 NOTE — Progress Notes (Signed)
Reviewed and agree with management plan.  Ahmia Colford T. Takeyla Million, MD FACG 

## 2014-07-19 LAB — TISSUE TRANSGLUTAMINASE, IGG: Tissue Transglut Ab: 1 U/mL (ref <6)

## 2014-07-25 ENCOUNTER — Ambulatory Visit (AMBULATORY_SURGERY_CENTER): Payer: BC Managed Care – PPO | Admitting: Gastroenterology

## 2014-07-25 ENCOUNTER — Encounter: Payer: Self-pay | Admitting: Gastroenterology

## 2014-07-25 VITALS — BP 118/50 | HR 72 | Temp 98.3°F | Resp 22 | Ht 65.5 in | Wt 140.0 lb

## 2014-07-25 DIAGNOSIS — D125 Benign neoplasm of sigmoid colon: Secondary | ICD-10-CM | POA: Diagnosis not present

## 2014-07-25 DIAGNOSIS — R1031 Right lower quadrant pain: Secondary | ICD-10-CM

## 2014-07-25 DIAGNOSIS — R198 Other specified symptoms and signs involving the digestive system and abdomen: Secondary | ICD-10-CM

## 2014-07-25 DIAGNOSIS — R194 Change in bowel habit: Secondary | ICD-10-CM | POA: Diagnosis not present

## 2014-07-25 DIAGNOSIS — R197 Diarrhea, unspecified: Secondary | ICD-10-CM | POA: Diagnosis not present

## 2014-07-25 MED ORDER — SODIUM CHLORIDE 0.9 % IV SOLN: 500.0000 mL | INTRAVENOUS | Status: DC

## 2014-07-25 NOTE — Progress Notes (Signed)
Called to room to assist during endoscopic procedure.  Patient ID and intended procedure confirmed with present staff. Received instructions for my participation in the procedure from the performing physician.  

## 2014-07-25 NOTE — Op Note (Signed)
[place]  [address]. [place], [address]   COLONOSCOPY PROCEDURE REPORT  PATIENT: Seth Bates, Seth Bates  MR#: [id number] BIRTHDATE: October 05, 1980 , 34  yrs. old GENDER: male ENDOSCOPIST: Ladene Artist, MD, [facility] REFERRED GL:OVFIE Vernell Morgans, M.D. PROCEDURE DATE:  07/25/2014 PROCEDURE:   Colonoscopy, diagnostic and Colonoscopy with biopsy First Screening Colonoscopy - Avg.  risk and is 50 yrs.  old or older Yes.  Prior Negative Screening - Now for repeat screening. N/A  History of Adenoma - Now for follow-up colonoscopy & has been > or = to 3 yrs.  N/A History of Adenoma - Now for follow-up colonoscopy & has been > or = to 3 yrs.  Polyps removed today? Yes ASA CLASS:   Class II INDICATIONS:Clinically significant diarrhea of unexplained origin and Colorectal Neoplasm Risk Assessment for this procedure is average risk. MEDICATIONS: Monitored anesthesia care and Propofol 300 mg IV DESCRIPTION OF PROCEDURE:   After the risks benefits and alternatives of the procedure were thoroughly explained, informed consent was obtained.  The digital rectal exam revealed no abnormalities of the rectum.   The LB [id number]  endoscope was introduced through the anus and advanced to the cecum, which was identified by both the appendix and ileocecal valve. No adverse events experienced.   The quality of the prep was excellent. (Suprep was used)  The instrument was then slowly withdrawn as the colon was fully examined. Estimated blood loss is zero unless otherwise noted in this procedure report.    COLON FINDINGS: Two sessile polyps measuring 5 mm in size were found in the sigmoid colon.  Polypectomies were performed with cold forceps.  The resection was complete, the polyp tissue was completely retrieved and sent to histology. The examination was otherwise normal. Random biopsies obtained throughout the colon. Retroflexed views revealed no abnormalities. The time to cecum  = 1.3 Withdrawal time = 10.9   The scope was withdrawn and the procedure completed. COMPLICATIONS: There were no immediate complications.  ENDOSCOPIC IMPRESSION: 1.   Two sessile polyps in the sigmoid colon; polypectomies performed with cold forceps 2.   The examination was otherwise normal  RECOMMENDATIONS: 1.  Await pathology results 2.  Repeat colonoscopy in 5 years if polyp(s) adenomatous; otherwise routine CRC screening at age 89  eSigned:  Ladene Artist, MD, [facility] 07/25/2014 9:47 AM

## 2014-07-25 NOTE — Patient Instructions (Signed)
Discharge instructions given. Handout on polyps. Resume previous medications. YOU HAD AN ENDOSCOPIC PROCEDURE TODAY AT THE  ENDOSCOPY CENTER:   Refer to the procedure report that was given to you for any specific questions about what was found during the examination.  If the procedure report does not answer your questions, please call your gastroenterologist to clarify.  If you requested that your care partner not be given the details of your procedure findings, then the procedure report has been included in a sealed envelope for you to review at your convenience later.  YOU SHOULD EXPECT: Some feelings of bloating in the abdomen. Passage of more gas than usual.  Walking can help get rid of the air that was put into your GI tract during the procedure and reduce the bloating. If you had a lower endoscopy (such as a colonoscopy or flexible sigmoidoscopy) you may notice spotting of blood in your stool or on the toilet paper. If you underwent a bowel prep for your procedure, you may not have a normal bowel movement for a few days.  Please Note:  You might notice some irritation and congestion in your nose or some drainage.  This is from the oxygen used during your procedure.  There is no need for concern and it should clear up in a day or so.  SYMPTOMS TO REPORT IMMEDIATELY:   Following lower endoscopy (colonoscopy or flexible sigmoidoscopy):  Excessive amounts of blood in the stool  Significant tenderness or worsening of abdominal pains  Swelling of the abdomen that is new, acute  Fever of 100F or higher   For urgent or emergent issues, a gastroenterologist can be reached at any hour by calling [phone].   DIET: Your first meal following the procedure should be a small meal and then it is ok to progress to your normal diet. Heavy or fried foods are harder to digest and may make you feel nauseous or bloated.  Likewise, meals heavy in dairy and vegetables can increase bloating.  Drink  plenty of fluids but you should avoid alcoholic beverages for 24 hours.  ACTIVITY:  You should plan to take it easy for the rest of today and you should NOT DRIVE or use heavy machinery until tomorrow (because of the sedation medicines used during the test).    FOLLOW UP: Our staff will call the number listed on your records the next business day following your procedure to check on you and address any questions or concerns that you may have regarding the information given to you following your procedure. If we do not reach you, we will leave a message.  However, if you are feeling well and you are not experiencing any problems, there is no need to return our call.  We will assume that you have returned to your regular daily activities without incident.  If any biopsies were taken you will be contacted by phone or by letter within the next 1-3 weeks.  Please call us at [phone] if you have not heard about the biopsies in 3 weeks.    SIGNATURES/CONFIDENTIALITY: You and/or your care partner have signed paperwork which will be entered into your electronic medical record.  These signatures attest to the fact that that the information above on your After Visit Summary has been reviewed and is understood.  Full responsibility of the confidentiality of this discharge information lies with you and/or your care-partner. 

## 2014-07-25 NOTE — Progress Notes (Signed)
A/ox3 pleased with MAC, report to Celia RN 

## 2014-07-26 ENCOUNTER — Telehealth: Payer: Self-pay | Admitting: *Deleted

## 2014-07-26 NOTE — Telephone Encounter (Signed)
  Follow up Call-no answer, left message to call if questions or concerns.     

## 2014-07-30 ENCOUNTER — Encounter: Payer: Self-pay | Admitting: Gastroenterology

## 2014-10-19 ENCOUNTER — Emergency Department (HOSPITAL_COMMUNITY): Payer: BC Managed Care – PPO

## 2014-10-19 ENCOUNTER — Emergency Department (HOSPITAL_COMMUNITY)
Admission: EM | Admit: 2014-10-19 | Discharge: 2014-10-19 | Disposition: A | Discharge status: Home / Self Care | Payer: BC Managed Care – PPO | Attending: Emergency Medicine | Admitting: Emergency Medicine

## 2014-10-19 ENCOUNTER — Encounter (HOSPITAL_COMMUNITY): Payer: Self-pay | Admitting: Emergency Medicine

## 2014-10-19 DIAGNOSIS — Z79899 Other long term (current) drug therapy: Secondary | ICD-10-CM | POA: Insufficient documentation

## 2014-10-19 DIAGNOSIS — R1031 Right lower quadrant pain: Secondary | ICD-10-CM | POA: Diagnosis present

## 2014-10-19 DIAGNOSIS — Z720 Tobacco use: Secondary | ICD-10-CM | POA: Diagnosis not present

## 2014-10-19 DIAGNOSIS — N200 Calculus of kidney: Secondary | ICD-10-CM | POA: Diagnosis not present

## 2014-10-19 LAB — URINE MICROSCOPIC-ADD ON

## 2014-10-19 LAB — CBC
HCT: 47.3 % (ref 39.0–52.0)
Hemoglobin: 16.6 g/dL (ref 13.0–17.0)
MCH: 30.6 pg (ref 26.0–34.0)
MCHC: 35.1 g/dL (ref 30.0–36.0)
MCV: 87.3 fL (ref 78.0–100.0)
PLATELETS: 350 10*3/uL (ref 150–400)
RBC: 5.42 MIL/uL (ref 4.22–5.81)
RDW: 12.6 % (ref 11.5–15.5)
WBC: 11.3 10*3/uL — AB (ref 4.0–10.5)

## 2014-10-19 LAB — URINALYSIS, ROUTINE W REFLEX MICROSCOPIC
BILIRUBIN URINE: NEGATIVE
GLUCOSE, UA: NEGATIVE mg/dL
Ketones, ur: 15 mg/dL — AB
LEUKOCYTES UA: NEGATIVE
NITRITE: NEGATIVE
PH: 5.5 (ref 5.0–8.0)
PROTEIN: NEGATIVE mg/dL
Specific Gravity, Urine: 1.025 (ref 1.005–1.030)
Urobilinogen, UA: 0.2 mg/dL (ref 0.0–1.0)

## 2014-10-19 LAB — BASIC METABOLIC PANEL
Anion gap: 11 (ref 5–15)
BUN: 11 mg/dL (ref 6–20)
CALCIUM: 9 mg/dL (ref 8.9–10.3)
CHLORIDE: 106 mmol/L (ref 101–111)
CO2: 23 mmol/L (ref 22–32)
CREATININE: 1.03 mg/dL (ref 0.61–1.24)
GFR calc non Af Amer: >60 mL/min (ref >60)
Glucose, Bld: 152 mg/dL — ABNORMAL HIGH (ref 65–99)
Potassium: 3.3 mmol/L — ABNORMAL LOW (ref 3.5–5.1)
SODIUM: 140 mmol/L (ref 135–145)

## 2014-10-19 MED ORDER — OXYCODONE-ACETAMINOPHEN 5-325 MG PO TABS
1.0000 | ORAL_TABLET | Freq: Four times a day (QID) | ORAL | Status: DC | PRN
Start: 2014-10-19 — End: 2016-05-24

## 2014-10-19 MED ORDER — FENTANYL CITRATE (PF) 100 MCG/2ML IJ SOLN
50.0000 ug | Freq: Once | INTRAMUSCULAR | Status: AC
  Administered 2014-10-19: 50 ug via INTRAVENOUS
  Filled 2014-10-19: qty 2

## 2014-10-19 MED ORDER — MORPHINE SULFATE (PF) 4 MG/ML IV SOLN
4.0000 mg | Freq: Once | INTRAVENOUS | Status: AC
  Administered 2014-10-19: 4 mg via INTRAVENOUS
  Filled 2014-10-19: qty 1

## 2014-10-19 MED ORDER — ONDANSETRON HCL 4 MG/2ML IJ SOLN
4.0000 mg | Freq: Once | INTRAMUSCULAR | Status: AC
  Administered 2014-10-19: 4 mg via INTRAVENOUS
  Filled 2014-10-19: qty 2

## 2014-10-19 MED ORDER — KETOROLAC TROMETHAMINE 30 MG/ML IJ SOLN
30.0000 mg | Freq: Once | INTRAMUSCULAR | Status: AC
  Administered 2014-10-19: 30 mg via INTRAVENOUS
  Filled 2014-10-19: qty 1

## 2014-10-19 NOTE — ED Notes (Signed)
Patient transported to CT 

## 2014-10-19 NOTE — Discharge Instructions (Signed)
Percocet as prescribed as needed for pain.  Follow-up with [facility] if not improving in the next 3 days. Their contact information has been provided in this discharge summary.  Return to the ER if he develops high fever, worsening pain, or other new and concerning symptoms.  Kidney Stones Kidney stones (urolithiasis) are deposits that form inside your kidneys. The intense pain is caused by the stone moving through the urinary tract. When the stone moves, the ureter goes into spasm around the stone. The stone is usually passed in the urine.  CAUSES   A disorder that makes certain neck glands produce too much parathyroid hormone (primary hyperparathyroidism).  A buildup of uric acid crystals, similar to gout in your joints.  Narrowing (stricture) of the ureter.  A kidney obstruction present at birth (congenital obstruction).  Previous surgery on the kidney or ureters.  Numerous kidney infections. SYMPTOMS   Feeling sick to your stomach (nauseous).  Throwing up (vomiting).  Blood in the urine (hematuria).  Pain that usually spreads (radiates) to the groin.  Frequency or urgency of urination. DIAGNOSIS   Taking a history and physical exam.  Blood or urine tests.  CT scan.  Occasionally, an examination of the inside of the urinary bladder (cystoscopy) is performed. TREATMENT   Observation.  Increasing your fluid intake.  Extracorporeal shock wave lithotripsy--This is a noninvasive procedure that uses shock waves to break up kidney stones.  Surgery may be needed if you have severe pain or persistent obstruction. There are various surgical procedures. Most of the procedures are performed with the use of small instruments. Only small incisions are needed to accommodate these instruments, so recovery time is minimized. The size, location, and chemical composition are all important variables that will determine the proper choice of action for you. Talk to your health  care provider to better understand your situation so that you will minimize the risk of injury to yourself and your kidney.  HOME CARE INSTRUCTIONS   Drink enough water and fluids to keep your urine clear or pale yellow. This will help you to pass the stone or stone fragments.  Strain all urine through the provided strainer. Keep all particulate matter and stones for your health care provider to see. The stone causing the pain may be as small as a grain of salt. It is very important to use the strainer each and every time you pass your urine. The collection of your stone will allow your health care provider to analyze it and verify that a stone has actually passed. The stone analysis will often identify what you can do to reduce the incidence of recurrences.  Only take over-the-counter or prescription medicines for pain, discomfort, or fever as directed by your health care provider.  Make a follow-up appointment with your health care provider as directed.  Get follow-up X-rays if required. The absence of pain does not always mean that the stone has passed. It may have only stopped moving. If the urine remains completely obstructed, it can cause loss of kidney function or even complete destruction of the kidney. It is your responsibility to make sure X-rays and follow-ups are completed. Ultrasounds of the kidney can show blockages and the status of the kidney. Ultrasounds are not associated with any radiation and can be performed easily in a matter of minutes. SEEK MEDICAL CARE IF:  You experience pain that is progressive and unresponsive to any pain medicine you have been prescribed. SEEK IMMEDIATE MEDICAL CARE IF:   Pain  cannot be controlled with the prescribed medicine.  You have a fever or shaking chills.  The severity or intensity of pain increases over 18 hours and is not relieved by pain medicine.  You develop a new onset of abdominal pain.  You feel faint or pass out.  You are  unable to urinate. MAKE SURE YOU:   Understand these instructions.  Will watch your condition.  Will get help right away if you are not doing well or get worse. Document Released: 01/18/2005 Document Revised: 09/20/2012 Document Reviewed: 06/21/2012 [facility] Patient Information 2015 [place], [place]. This information is not intended to replace advice given to you by your health care provider. Make sure you discuss any questions you have with your health care provider.

## 2014-10-19 NOTE — ED Notes (Signed)
Pt woke up at 4 am with 10/10 right side flank pain and unable to urinate. One episode of vomiting on the bathroom.

## 2014-10-19 NOTE — ED Provider Notes (Signed)
CSN: [id number]     Arrival date & time 10/19/14  7169 History   First MD Initiated Contact with Patient 10/19/14 0606     Chief Complaint  Patient presents with  . Flank Pain    unable to urinate     (Consider location/radiation/quality/duration/timing/severity/associated sxs/prior Treatment) HPI Comments: Patient is a 34 year old male patient is a 34 year old male who presents with complaints of severe right flank pain and urinary urgency that started approximately one hour prior to arrival. Patient went to bed last night feeling well and symptoms were present and woke him from sleep. He denies any bowel complaints. He denies any fevers or chills.  Patient is a 34 y.o. male presenting with flank pain. The history is provided by the patient.  Flank Pain This is a new problem. The current episode started 1 to 2 hours ago. The problem occurs constantly. The problem has been rapidly worsening. Nothing aggravates the symptoms. Nothing relieves the symptoms. He has tried nothing for the symptoms. The treatment provided no relief.    Past Medical History  Diagnosis Date  . Allergic rhinitis    History reviewed. No pertinent past surgical history. History reviewed. No pertinent family history. Social History  Substance Use Topics  . Smoking status: Current Every Day Smoker -- 0.25 packs/day    Types: Cigarettes  . Smokeless tobacco: Never Used  . Alcohol Use: No    Review of Systems  Genitourinary: Positive for flank pain.  All other systems reviewed and are negative.     Allergies  Review of patient's allergies indicates no known allergies.  Home Medications   Prior to Admission medications   Medication Sig Start Date End Date Taking? Authorizing Provider  hyoscyamine (LEVBID) 0.375 MG 12 hr tablet Take 1 tablet (0.375 mg total) by mouth every 12 (twelve) hours as needed for cramping. 02/05/14   Abner Greenspan, MD  naproxen sodium (ANAPROX) 220 MG tablet Take 220 mg by mouth as  needed.    Historical Provider, MD  ranitidine (ZANTAC) 150 MG capsule Take 1 capsule (150 mg total) by mouth 2 (two) times daily. 07/09/14   Wynelle Fanny Tower, MD   BP 118/84 mmHg  Pulse 88  Temp(Src) 98 F (36.7 C) (Oral)  Resp 23  Ht 5\' 6"  (1.676 m)  Wt 140 lb (63.504 kg)  BMI 22.61 kg/m2  SpO2 100% Physical Exam  Constitutional: He is oriented to person, place, and time. He appears well-developed and well-nourished. No distress.  HENT:  Head: Normocephalic and atraumatic.  Neck: Normal range of motion. Neck supple.  Cardiovascular: Normal rate, regular rhythm and normal heart sounds.   No murmur heard. Pulmonary/Chest: Effort normal and breath sounds normal. No respiratory distress. He has no wheezes.  Abdominal: Soft. Bowel sounds are normal. He exhibits no distension. There is no tenderness.  There is mild right-sided CVA tenderness.  Musculoskeletal: Normal range of motion.  Neurological: He is alert and oriented to person, place, and time.  Skin: Skin is warm and dry. He is not diaphoretic.  Nursing note and vitals reviewed.   ED Course  Procedures (including critical care time) Labs Review Labs Reviewed  URINALYSIS, ROUTINE W REFLEX MICROSCOPIC (NOT AT [facility])  BASIC METABOLIC PANEL  CBC    Imaging Review No results found. I have personally reviewed and evaluated these images and lab results as part of my medical decision-making.   EKG Interpretation None      MDM   Final diagnoses:  None  CT scan reveals a 3 mm stone in the right distal ureter. He is feeling much improved with medications given in the ER. He will be discharged with pain medication and when necessary follow-up with urology if he is not feeling better in the next few days.    Veryl Speak, MD 10/19/14 [phone]

## 2016-05-24 ENCOUNTER — Encounter: Payer: Self-pay | Admitting: Internal Medicine

## 2016-05-24 ENCOUNTER — Ambulatory Visit (INDEPENDENT_AMBULATORY_CARE_PROVIDER_SITE_OTHER): Payer: BC Managed Care – PPO | Admitting: Internal Medicine

## 2016-05-24 VITALS — BP 108/70 | HR 76 | Temp 98.5°F | Wt 132.0 lb

## 2016-05-24 DIAGNOSIS — M545 Low back pain, unspecified: Secondary | ICD-10-CM

## 2016-05-24 MED ORDER — CYCLOBENZAPRINE HCL 5 MG PO TABS: 5.0000 mg | ORAL_TABLET | Freq: Every evening | ORAL | 0 refills | Status: DC | PRN

## 2016-05-24 NOTE — Patient Instructions (Signed)

## 2016-05-24 NOTE — Progress Notes (Signed)
Subjective:    Patient ID: Seth Bates, male    DOB: [birth year], 36 y.o.   MRN: [id number]  HPI  Pt presents to the clinic today with c/o low back pain. He reports this started 2-3 days ago. He describes the pain as sharp and stabbing. The pain does radiate into his right buttock. He reports associated muscle tightness/back spasms.The pain does not radiate into his legs. He denies numbness and tingling or loss of bowel or bladder. He denies any injury to the area. He has tried heat, ice, and Aleve with some relief.  Review of Systems      Past Medical History:  Diagnosis Date  . Allergic rhinitis     Current Outpatient Prescriptions  Medication Sig Dispense Refill  . naproxen sodium (ANAPROX) 220 MG tablet Take 220 mg by mouth as needed.     No current facility-administered medications for this visit.     No Known Allergies  No family history on file.  Social History   Social History  . Marital status: Single    Spouse name: N/A  . Number of children: 2  . Years of education: N/A   Occupational History  . Teacher, adult education    Social History Main Topics  . Smoking status: Current Every Day Smoker    Packs/day: 0.25    Types: Cigarettes  . Smokeless tobacco: Never Used  . Alcohol use No  . Drug use: No  . Sexual activity: Not on file   Other Topics Concern  . Not on file   Social History Narrative  . No narrative on file     Constitutional: Denies fever, malaise, fatigue, headache or abrupt weight changes.  Gastrointestinal: Denies abdominal pain, bloating, constipation, diarrhea or blood in the stool.  GU: Denies urgency, frequency, pain with urination, burning sensation, blood in urine, odor or discharge. Musculoskeletal: Pt reports low back pain. Denies decrease in range of motion, difficulty with gait, or joint swelling.    No other specific complaints in a complete review of systems (except as listed in HPI above).  Objective:   Physical  Exam   BP 108/70   Pulse 76   Temp 98.5 F (36.9 C) (Oral)   Wt 132 lb (59.9 kg)   SpO2 97%   BMI 21.31 kg/m  Wt Readings from Last 3 Encounters:  05/24/16 132 lb (59.9 kg)  10/19/14 140 lb (63.5 kg)  07/25/14 140 lb (63.5 kg)    General: Appears his stated age, in NAD. Abdomen: Soft and nontender.  Musculoskeletal: Pain with extension of the spine. Normal flexion, rotation and lateral bending. Strength 5/5 BUE/BLE. No difficulty with gait.    BMET    Component Value Date/Time   NA 140 10/19/2014 0608   K 3.3 (L) 10/19/2014 0608   CL 106 10/19/2014 0608   CO2 23 10/19/2014 0608   GLUCOSE 152 (H) 10/19/2014 0608   BUN 11 10/19/2014 0608   CREATININE 1.03 10/19/2014 0608   CALCIUM 9.0 10/19/2014 0608   GFRNONAA >60 10/19/2014 0608   GFRAA >60 10/19/2014 0608    Lipid Panel  No results found for: CHOL, TRIG, HDL, CHOLHDL, VLDL, LDLCALC  CBC    Component Value Date/Time   WBC 11.3 (H) 10/19/2014 0608   RBC 5.42 10/19/2014 0608   HGB 16.6 10/19/2014 0608   HCT 47.3 10/19/2014 0608   PLT 350 10/19/2014 0608   MCV 87.3 10/19/2014 0608   MCH 30.6 10/19/2014 0608   MCHC 35.1 10/19/2014  7505   RDW 12.6 10/19/2014 0608   LYMPHSABS 2.8 07/18/2014 1421   MONOABS 0.8 07/18/2014 1421   EOSABS 0.0 07/18/2014 1421   BASOSABS 0.1 07/18/2014 1421    Hgb A1C No results found for: HGBA1C         Assessment & Plan:   Low Back Pain:  Stretching exercises given Advised him to continue Aleve and heat eRx for Flexeril 5 mg QHS prn  RTC as needed or if symptoms persist or worsen BAITY, REGINA, NP

## 2016-10-06 ENCOUNTER — Ambulatory Visit (INDEPENDENT_AMBULATORY_CARE_PROVIDER_SITE_OTHER): Payer: BC Managed Care – PPO | Admitting: Family Medicine

## 2016-10-06 ENCOUNTER — Encounter: Payer: Self-pay | Admitting: Family Medicine

## 2016-10-06 ENCOUNTER — Encounter: Payer: Self-pay | Admitting: *Deleted

## 2016-10-06 VITALS — BP 116/64 | HR 79 | Temp 98.2°F | Wt 132.2 lb

## 2016-10-06 DIAGNOSIS — F17200 Nicotine dependence, unspecified, uncomplicated: Secondary | ICD-10-CM

## 2016-10-06 DIAGNOSIS — M79622 Pain in left upper arm: Secondary | ICD-10-CM | POA: Insufficient documentation

## 2016-10-06 MED ORDER — NAPROXEN 500 MG PO TABS: ORAL_TABLET | ORAL | 0 refills | Status: DC

## 2016-10-06 MED ORDER — VARENICLINE TARTRATE 0.5 MG X 11 & 1 MG X 42 PO MISC: ORAL | 0 refills | Status: DC

## 2016-10-06 MED ORDER — VARENICLINE TARTRATE 1 MG PO TABS: 1.0000 mg | ORAL_TABLET | Freq: Two times a day (BID) | ORAL | 1 refills | Status: DC

## 2016-10-06 NOTE — Assessment & Plan Note (Signed)
Pt interested in chantix Rx - provided today. Reviewed use of chantix as well as risks and benefits of medication.  Discussed setting quit date 2 wks into Rx.  Update with effect.

## 2016-10-06 NOTE — Progress Notes (Signed)
BP 116/64   Pulse 79   Temp 98.2 F (36.8 C) (Oral)   Wt 132 lb 4 oz (60 kg)   SpO2 97%   BMI 21.35 kg/m    CC: L arm pain Subjective:    Patient ID: Seth Bates, male    DOB: 04-11-1980, 36 y.o.   MRN: [id number]  HPI: Seth Bates is a 36 y.o. male presenting on 10/06/2016 for Arm Pain (L. Denies any injuries)   1 wk h/o sharp burning pain L upper arm as well as some numbness and paresthesias down arm to fingers. Worse with stretching, pulling, tugging. Denies inciting trauma/injury. Pain reproducible with lifting arm overhead.  No neck pain, fevers/chills.  Has tried aleve with only mild relief.  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  He does not drink alcohol.   Current smoker 1/4 ppd. Interested in chantix. Hasn't tried NRT.   Relevant past medical, surgical, family and social history reviewed and updated as indicated. Interim medical history since our last visit reviewed. Allergies and medications reviewed and updated. Outpatient Medications Prior to Visit  Medication Sig Dispense Refill  . cyclobenzaprine (FLEXERIL) 5 MG tablet Take 1 tablet (5 mg total) by mouth at bedtime as needed for muscle spasms. 10 tablet 0  . naproxen sodium (ANAPROX) 220 MG tablet Take 220 mg by mouth as needed.     No facility-administered medications prior to visit.      Per HPI unless specifically indicated in ROS section below Review of Systems     Objective:    BP 116/64   Pulse 79   Temp 98.2 F (36.8 C) (Oral)   Wt 132 lb 4 oz (60 kg)   SpO2 97%   BMI 21.35 kg/m   Wt Readings from Last 3 Encounters:  10/06/16 132 lb 4 oz (60 kg)  05/24/16 132 lb (59.9 kg)  10/19/14 140 lb (63.5 kg)    Physical Exam  Constitutional: He is oriented to person, place, and time. He appears well-developed and well-nourished. No distress.  Musculoskeletal: He exhibits no edema.  FROM at neck without pain or limitation No midline cervical pain or paracervical or trap  mm tenderness R shoulder WNL L Shoulder exam: No deformity of shoulders on inspection. No pain with palpation of shoulder landmarks. FROM in abduction and forward flexion. No pain or weakness with testing SITS in ext/int rotation. Fluctuating pain with empty can sign - not reliably reproducible. Neg Speed test. No impingement. No pain with rotation of humeral head in [facility] joint.   Neurological: He is alert and oriented to person, place, and time. He has normal strength. No sensory deficit.  Skin: Skin is warm and dry. No rash noted. No erythema.  Nursing note and vitals reviewed.     Assessment & Plan:   Problem List Items Addressed This Visit    Left upper arm pain - Primary    Story sounds like arm neuralgia, overall benign exam, pain is reproducible with elevation of arm above head.  ?supraspinatus tendonitis vs L upper arm neuritis.  rec course of NSAID and rest from work (out of work rest of week letter provided). If no better, suggested return for f/u with Dr Lorelei Pont for further evaluation.  No rash suggestive of shingles.       TOBACCO ABUSE    Pt interested in chantix Rx - provided today. Reviewed use of chantix as well as risks and benefits of medication.  Discussed setting quit date 2  wks into Rx.  Update with effect.           Follow up plan: Return if symptoms worsen or fail to improve.  Seth Bush, MD

## 2016-10-06 NOTE — Assessment & Plan Note (Signed)
Story sounds like arm neuralgia, overall benign exam, pain is reproducible with elevation of arm above head.  ?supraspinatus tendonitis vs L upper arm neuritis.  rec course of NSAID and rest from work (out of work rest of week letter provided). If no better, suggested return for f/u with Dr Lorelei Pont for further evaluation.  No rash suggestive of shingles.

## 2016-10-06 NOTE — Patient Instructions (Addendum)
Try chantix - prescriptions printed out today. Let us know how you do with it.  I think you have possibly strained supraspinatus tendon causing some of this pain, alternatively pinching of a superficial nerve causing these symptoms. Treat with prescription anti inflammatory course 500mg  twice daily with meals for 1 week, avoid aggravating positions. Take the rest of the week. Letter provided today. If no better, return to see Dr Lorelei Pont sports medicine.

## 2016-10-11 ENCOUNTER — Ambulatory Visit (INDEPENDENT_AMBULATORY_CARE_PROVIDER_SITE_OTHER): Payer: BC Managed Care – PPO | Admitting: Sports Medicine

## 2016-10-11 ENCOUNTER — Ambulatory Visit (INDEPENDENT_AMBULATORY_CARE_PROVIDER_SITE_OTHER): Payer: BC Managed Care – PPO

## 2016-10-11 ENCOUNTER — Encounter: Payer: Self-pay | Admitting: Sports Medicine

## 2016-10-11 VITALS — BP 112/80 | HR 74 | Ht 66.0 in | Wt 132.0 lb

## 2016-10-11 DIAGNOSIS — M79622 Pain in left upper arm: Secondary | ICD-10-CM | POA: Diagnosis not present

## 2016-10-11 DIAGNOSIS — R202 Paresthesia of skin: Secondary | ICD-10-CM | POA: Diagnosis not present

## 2016-10-11 MED ORDER — IBUPROFEN-FAMOTIDINE 800-26.6 MG PO TABS: ORAL_TABLET | ORAL | 2 refills | Status: DC

## 2016-10-11 NOTE — Progress Notes (Signed)
OFFICE VISIT NOTE Seth Bates. Seth Bates, [place] [facility] [place] - 36 y.o. male MRN [id number]  Date of birth: Oct 01, 1980  Visit Date: 10/11/2016  PCP: Abner Greenspan, MD   Referred by: Tower, Wynelle Fanny, MD  Burlene Arnt, CMA acting as scribe for Dr. Paulla Fore.  SUBJECTIVE:   Chief Complaint  Patient presents with  . New Patient (Initial Visit)    LT upper arm pain   HPI: As below and per problem based documentation when appropriate.  Seth Bates is a new patient referred by Dr. Danise Mina for evaluation of LT upper arm pain. Pain started a little over 2 weeks ago.  No known injury or trauma.   The pain is described as sharp, burning, and numbness. Pain and is rated as 40/98 with certain movements.  Worsened with stretching, pulling, tugging.  Improves with rest Therapies tried include : Aleve has provided mild relief. He has tried using ice with no relief.   Other associated symptoms include: paresthesias down arm to fingers He feels like his LT hand is colder than his right hand. He has tingling in arm and fingers but that seems to have resolved. Pt reports that he was told not to use his LT arm so he is unsure if there is any weakness.     Review of Systems  Constitutional: Positive for chills. Negative for fever.  Respiratory: Negative for shortness of breath and wheezing.   Cardiovascular: Negative for chest pain and palpitations.  Gastrointestinal: Negative.   Genitourinary: Negative.   Musculoskeletal: Positive for myalgias. Negative for falls.  Neurological: Positive for tingling. Negative for dizziness and headaches.  Endo/Heme/Allergies: Does not bruise/bleed easily.    Otherwise per HPI.  HISTORY & PERTINENT PRIOR DATA:  No specialty comments available. He reports that he has been smoking Cigarettes.  He has been smoking about 0.25 packs per day. He has never used smokeless tobacco. No results  for input(s): HGBA1C, LABURIC in the last 8760 hours. Medications & Allergies reviewed per EMR Patient Active Problem List   Diagnosis Date Noted  . Left upper arm pain 10/06/2016  . Gastritis 07/09/2014  . Diarrhea 02/05/2014  . Abdominal cramping 02/05/2014  . Potential for corneal abrasion 08/13/2013  . Right otitis externa 07/18/2013  . Acute bacterial sinusitis 04/17/2013  . Vertigo 02/07/2012  . Allergic rhinitis   . Enteritis 10/21/2011  . 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 TOBACCO ABUSE 04/23/2008   Past Medical History:  Diagnosis Date  . Allergic rhinitis    No family history on file. No past surgical history on file. Social History   Occupational History  . Teacher, adult education    Social History Main Topics  . Smoking status: Current Every Day Smoker    Packs/day: 0.25    Types: Cigarettes  . Smokeless tobacco: Never Used  . Alcohol use No  . Drug use: No  . Sexual activity: Not on file    OBJECTIVE:  VS:  HT:5\' 6"  (167.6 cm)   WT:132 lb (59.9 kg)  BMI:21.32    BP:112/80  HR:74bpm  TEMP: ( )  RESP:96 % EXAM: Findings:  WDWN, NAD, Non-toxic appearing Alert & appropriately interactive Not depressed or anxious appearing No increased work of breathing. Pupils are equal. EOM intact without nystagmus No clubbing or cyanosis of the extremities appreciated No significant rashes/lesions/ulcerations overlying the examined area. Radial pulses 2+/4.  No significant generalized UE edema. Sensation intact to  light touch in upper extremities.  Neck & Shoulders: Well aligned, no significant torticollis No significant midline tenderness.   Generalized TTP over the left anterior shoulder. Cervical ROM: Overall well-maintained however terminal leftward side bending and rotation is painful for him NEURAL TENSION SIGNS Right       Brachial Plexus Squeeze: Non-tender      Arm Squeeze Test: Non-tender       Spurling's Compression Test:  Ipsilateral -negative/  No radiation  Left       Brachial Plexus Squeeze: Non-tender      Arm Squeeze Test: Painful and reproduces the majority of symptoms      Spurling's Compression Test:  Ipsilateral -painful  Lhermitte's Compression test:  Painful without radiation localizing to the posterior neck   REFLEXES                           Right                         Left DTR - C5 -Biceps               2+/4                       2+/4 DTR - C6 - Brachiorad 2+/4                       2+/4 DTR - C7 - Triceps              2+/4                       2+/4 UMN - Hoffman's negative/Normal negative/Normal  MOTOR TESTING: Intact in all UE myotomes  Shoulder exam is overall well aligned with normal internal rotation external rotation, empty can speeds and O'Brien's testing.     Dg Cervical Spine Complete  Result Date: 10/11/2016 CLINICAL DATA:  Left upper arm pain for 2 weeks.  Numbness. EXAM: CERVICAL SPINE - COMPLETE 4+ VIEW COMPARISON:  None. FINDINGS: Alignment is normal. Prevertebral soft tissues are normal. No disc space narrowing. No appreciable facet arthritis. Neural foramina are widely patent bilaterally. IMPRESSION: Negative cervical spine radiographs. Electronically Signed   By: Lorriane Shire M.D.   On: 10/11/2016 14:06   ASSESSMENT & PLAN:     ICD-10-CM   1. Left upper arm pain M79.622 DG Cervical Spine Complete    Ibuprofen-Famotidine (DUEXIS) 800-26.6 MG TABS   ================================================================= Left upper arm pain Symptoms are consistent with cervical radiculitis.  Discussed multiple options including therapeutic exercises anti-inflammatories, steroids, gabapentinoids he would like to begin with anti-inflammatories and therapeutic exercises and see how he progresses.    ================================================================= Patient Instructions  Also check out [employer]" which is a program developed by Dr. Minerva Ends.   There are links to a  couple of his YouTube Videos below and I would like to see performing one of his videos 5-6 days per week.    A good intro video is: "Independence from Pain 7-minute Video" - [email or web address]   His more advanced video is: "Powerful Posture and Pain Relief: 12 minutes of Foundation Training" - https://youtu.[email or web address]  Do not try to attempt this entire video when first beginning.    Try breaking of each exercise that he goes into shorter segments.  Otherwise if they perform an exercise for 45 seconds, start with 15 seconds and  rest and then resume when they begin the new activity.    If you work your way up to doing this 12 minute video, I expect you will see significant improvements in your pain.  If you enjoy his videos and would like to find out more you can look on his website: [email or web address]  He has a workout streaming option as well as a DVD set available for purchase.  Amazon has the best price for his DVDs.     =================================================================  Follow-up: Return in about 4 weeks (around 11/08/2016).   CMA/ATC served as Education administrator during this visit. History, Physical, and Plan performed by medical provider. Documentation and orders reviewed and attested to.      Teresa Coombs, [facility] Physician

## 2016-10-31 NOTE — Patient Instructions (Signed)

## 2016-10-31 NOTE — Assessment & Plan Note (Signed)
Symptoms are consistent with cervical radiculitis.  Discussed multiple options including therapeutic exercises anti-inflammatories, steroids, gabapentinoids he would like to begin with anti-inflammatories and therapeutic exercises and see how he progresses.

## 2016-11-08 ENCOUNTER — Encounter: Payer: Self-pay | Admitting: Sports Medicine

## 2016-11-08 ENCOUNTER — Ambulatory Visit (INDEPENDENT_AMBULATORY_CARE_PROVIDER_SITE_OTHER): Payer: BC Managed Care – PPO

## 2016-11-08 ENCOUNTER — Ambulatory Visit (INDEPENDENT_AMBULATORY_CARE_PROVIDER_SITE_OTHER): Payer: BC Managed Care – PPO | Admitting: Sports Medicine

## 2016-11-08 VITALS — BP 120/78 | HR 72 | Ht 66.0 in | Wt 133.4 lb

## 2016-11-08 DIAGNOSIS — M79622 Pain in left upper arm: Secondary | ICD-10-CM | POA: Diagnosis not present

## 2016-11-08 DIAGNOSIS — F17200 Nicotine dependence, unspecified, uncomplicated: Secondary | ICD-10-CM | POA: Diagnosis not present

## 2016-11-08 NOTE — Progress Notes (Signed)
OFFICE VISIT NOTE Seth Bates. Seth Bates, [place] at [place] - 36 y.o. male MRN [id number]  Date of birth: [birth year]  Visit Date: 11/08/2016  PCP: Abner Greenspan, MD   Referred by: Tower, Wynelle Fanny, MD  Thalia Bloodgood PT, LAT, ATC acting as scribe for Dr. Paulla Fore.  SUBJECTIVE:   Chief Complaint  Patient presents with  . Follow-up    Cervical radiculitis   HPI: As below and per problem based documentation when appropriate.  Mr. Seth Bates is an established pt presenting today for f/u of his cervical radiculopathy w/ associated L UE pain.  Pt was last seen on 10/11/16 and was prescribed Goodman's exercises and Duexis.   Pt states that he still feels some N/T along his L distal deltoid.  Pt states that Apley's ER position irritates his symptoms.  He also reports some prior issues when attempting to lift items.  Pt notes about  50% improvement in his symptoms.  He notes that he no longer has tingling down the entire length of his L UE.  Pt states that he continues to take the Duexis and that he is doing the Comeri­o exercises approximately every other day.  He notes that the exercises make his arm tired.              Review of Systems  Constitutional: Negative for chills, fever and weight loss.  HENT: Negative.   Eyes: Negative.   Respiratory: Negative for cough, shortness of breath and wheezing.   Cardiovascular: Negative for chest pain and palpitations.  Gastrointestinal: Negative for abdominal pain, heartburn and nausea.  Genitourinary: Negative.   Musculoskeletal: Positive for joint pain. Negative for falls.  Neurological: Negative for dizziness and headaches.  Endo/Heme/Allergies: Does not bruise/bleed easily.  Psychiatric/Behavioral: Negative for depression. The patient has insomnia. The patient is not nervous/anxious.     Otherwise per HPI.  HISTORY & PERTINENT PRIOR DATA:  No specialty comments  available. He reports that he has been smoking Cigarettes.  He has been smoking about 0.25 packs per day. He has never used smokeless tobacco. No results for input(s): HGBA1C, LABURIC in the last 8760 hours. Allergies reviewed per EMR Prior to Admission medications   Medication Sig Start Date End Date Taking? Authorizing Provider  Ibuprofen-Famotidine (DUEXIS) 800-26.6 MG TABS 1 tab po tid X 14 days then 1 tab po tid as needed 10/11/16  Yes Gerda Diss, DO  varenicline (CHANTIX CONTINUING MONTH PAK) 1 MG tablet Take 1 tablet (1 mg total) by mouth 2 (two) times daily. 10/06/16  Yes Ria Bush, MD  varenicline (CHANTIX STARTING MONTH PAK) 0.5 MG X 11 & 1 MG X 42 tablet Take one 0.5 mg tablet by mouth once daily for 3 days, then increase to one 0.5 mg tablet twice daily for 4 days, then increase to one 1 mg tablet twice daily. 10/06/16  Yes Ria Bush, MD   Patient Active Problem List   Diagnosis Date Noted  . Left upper arm pain 10/06/2016  . Gastritis 07/09/2014  . Diarrhea 02/05/2014  . Abdominal cramping 02/05/2014  . Potential for corneal abrasion 08/13/2013  . Right otitis externa 07/18/2013  . Acute bacterial sinusitis 04/17/2013  . Vertigo 02/07/2012  . Allergic rhinitis   . Enteritis 10/21/2011  . 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 TOBACCO ABUSE 04/23/2008   Past Medical History:  Diagnosis Date  . Allergic rhinitis    No family history  on file. No past surgical history on file. Social History   Occupational History  . Teacher, adult education    Social History Main Topics  . Smoking status: Current Some Day Smoker    Packs/day: 0.25    Types: Cigarettes  . Smokeless tobacco: Never Used  . Alcohol use No  . Drug use: No  . Sexual activity: Not on file    OBJECTIVE:  VS:  HT:5\' 6"  (167.6 cm)   WT:133 lb 6.4 oz (60.5 kg)  BMI:21.54    BP:120/78  HR:72bpm  TEMP: ( )  RESP:98 % EXAM: Findings:  Adult male.  No acute distress.  Alert and appropriate.   His left shoulder is overall held in a moderate degree of protraction.  He has full overhead range of motion with some hypermobility of the left shoulder.  He has pain and crepitation with axial load and circumduction but the pain is mild.  Deep crepitation with no significant AC joint localization.  Empty can testing, internal rotation, external rotation strength is 5 out of 5 and nonpainful.  Speeds test is slightly uncomfortable with a markedly weak O'Brien's test.    RADIOLOGY: DG Cervical Spine Complete CLINICAL DATA:  Left upper arm pain for 2 weeks.  Numbness.  EXAM: CERVICAL SPINE - COMPLETE 4+ VIEW  COMPARISON:  None.  FINDINGS: Alignment is normal. Prevertebral soft tissues are normal. No disc space narrowing. No appreciable facet arthritis. Neural foramina are widely patent bilaterally.  IMPRESSION: Negative cervical spine radiographs.  Electronically Signed   By: Lorriane Shire M.D.   On: 10/11/2016 14:06  ASSESSMENT & PLAN:     ICD-10-CM   1. Left upper arm pain M79.622 MR SHOULDER LEFT W CONTRAST    DG Arthro Shoulder Left    DG Shoulder Left  2. TOBACCO ABUSE F17.200    ================================================================= Left upper arm pain Symptoms previously consistent with a cervical radiculitis have essentially resolved however he is having now focal left deep and lateral shoulder pain consistent with a likely labral tear.  Further diagnostic evaluation with MRI arthrogram indicated.  Will obtain an x-ray prior to leaving but anticipate this being normal.  Okay to discontinue Duexis at this time but if he has return of his symptoms he can continue this until he returns for discussion of his MRI.  TOBACCO ABUSE 3 days of being smoke-free, congratulated him on this and encouraged him to continue to be abstinent.  No notes on file ================================================================= Patient Instructions  We are ordering an MRI for  you today.  The imaging office will be calling you to schedule your appointment after we obtain authorization from your insurance company.   Please be sure you have signed up for MyChart so that we can get your results to you.  We will be in touch with you as soon as we can.  Please know, it can take up to 3-4 business days for the radiologist and Dr. Paulla Fore to have time to review the results and determine the best appropriate action.  If there is something that appears to be surgical or needs a referral to other specialists we will let you know through Daisy or telephone.  Otherwise we will plan to schedule a follow up appointment with Dr. Paulla Fore once we have the results.    ================================================================= No future appointments.  Follow-up: Return for MRI review.   CMA/ATC served as Education administrator during this visit. History, Physical, and Plan performed by medical provider. Documentation and orders reviewed and attested to.  Teresa Coombs, [facility] Physician

## 2016-11-08 NOTE — Assessment & Plan Note (Addendum)
Symptoms previously consistent with a cervical radiculitis have essentially resolved however he is having now focal left deep and lateral shoulder pain consistent with a likely labral tear.  Further diagnostic evaluation with MRI arthrogram indicated.  Will obtain an x-ray prior to leaving but anticipate this being normal.  Okay to discontinue Duexis at this time but if he has return of his symptoms he can continue this until he returns for discussion of his MRI.

## 2016-11-08 NOTE — Patient Instructions (Signed)

## 2016-11-08 NOTE — Assessment & Plan Note (Signed)
3 days of being smoke-free, congratulated him on this and encouraged him to continue to be abstinent.

## 2017-01-05 ENCOUNTER — Encounter: Payer: Self-pay | Admitting: Family Medicine

## 2017-01-05 ENCOUNTER — Ambulatory Visit: Payer: BC Managed Care – PPO | Admitting: Family Medicine

## 2017-01-05 VITALS — BP 120/74 | HR 86 | Temp 98.2°F | Wt 137.8 lb

## 2017-01-05 DIAGNOSIS — J3489 Other specified disorders of nose and nasal sinuses: Secondary | ICD-10-CM

## 2017-01-05 DIAGNOSIS — H6592 Unspecified nonsuppurative otitis media, left ear: Secondary | ICD-10-CM

## 2017-01-05 MED ORDER — FLUTICASONE PROPIONATE 50 MCG/ACT NA SUSP: 2.0000 | Freq: Every day | NASAL | 6 refills | Status: DC

## 2017-01-05 NOTE — Patient Instructions (Signed)
Take sudafed (generic is fine)- twice a day, morning and early afternoon  Afrin twice a day for 4 days max  Fluticasone nasal spray at bedtime for at least two weeks, use after Arin  Take ibuprofen 2-3 tablets every 8-12 hours  If not better in 5 days let me know

## 2017-01-05 NOTE — Progress Notes (Signed)
, °

## 2017-01-05 NOTE — Progress Notes (Signed)
   Subjective:    Patient ID: Seth Bates, male    DOB: 02-21-1980, 36 y.o.   MRN: [id number]  HPI This is a 36 yo male who presents today with sinus pressure x 2 weeks. Wakes with a headache, has pressure behind eyes, in ears, no nasal drainage or post nasal drainage, + sore throat, no cough. Subjective fever, fatigued. Lightheaded. Has been taking Dayquil/Nyquil, ibuprofen 200 mg daily with little relief.   Past Medical History:  Diagnosis Date  . Allergic rhinitis    No past surgical history on file. No family history on file. Social History   Tobacco Use  . Smoking status: Current Some Day Smoker    Packs/day: 0.25    Types: Cigarettes  . Smokeless tobacco: Never Used  Substance Use Topics  . Alcohol use: No    Alcohol/week: 0.0 oz  . Drug use: No      Review of Systems Per HPI    Objective:   Physical Exam  Constitutional: He is oriented to person, place, and time. He appears well-developed and well-nourished. No distress.  HENT:  Head: Normocephalic and atraumatic.  Right Ear: Tympanic membrane, external ear and ear canal normal.  Left Ear: External ear normal. A middle ear effusion is present.  Nose: Nose normal. Right sinus exhibits no maxillary sinus tenderness and no frontal sinus tenderness. Left sinus exhibits no maxillary sinus tenderness and no frontal sinus tenderness.  Mouth/Throat: Uvula is midline, oropharynx is clear and moist and mucous membranes are normal.  Eyes: Conjunctivae are normal.  Neck: Normal range of motion. Neck supple.  Cardiovascular: Normal rate, regular rhythm and normal heart sounds.  Pulmonary/Chest: Effort normal and breath sounds normal.  Lymphadenopathy:    He has no cervical adenopathy.  Neurological: He is alert and oriented to person, place, and time.  Skin: Skin is warm and dry. He is not diaphoretic.  Psychiatric: He has a normal mood and affect. His behavior is normal. Judgment and thought content normal.  Vitals  reviewed.     BP 120/74 (BP Location: Right Arm, Patient Position: Sitting, Cuff Size: Normal)   Pulse 86   Temp 98.2 F (36.8 C) (Oral)   Wt 137 lb 12 oz (62.5 kg)   SpO2 98%   BMI 22.23 kg/m  Wt Readings from Last 3 Encounters:  01/05/17 137 lb 12 oz (62.5 kg)  11/08/16 133 lb 6.4 oz (60.5 kg)  10/11/16 132 lb (59.9 kg)       Assessment & Plan:  1. Sinus pressure - Patient Instructions  Take sudafed (generic is fine)- twice a day, morning and early afternoon  Afrin twice a day for 4 days max  Fluticasone nasal spray at bedtime for at least two weeks, use after Arin  Take ibuprofen 2-3 tablets every 8-12 hours  If not better in 5 days let me know   - fluticasone (FLONASE) 50 MCG/ACT nasal spray; Place 2 sprays into both nostrils daily.  Dispense: 16 g; Refill: 6  2. Middle ear effusion, left - fluticasone (FLONASE) 50 MCG/ACT nasal spray; Place 2 sprays into both nostrils daily.  Dispense: 16 g; Refill: [place], FNP-BC  [facility], [place]  01/05/2017 8:13 AM

## 2017-01-17 ENCOUNTER — Ambulatory Visit: Payer: BC Managed Care – PPO | Admitting: Family Medicine

## 2017-01-17 ENCOUNTER — Encounter: Payer: Self-pay | Admitting: Family Medicine

## 2017-01-17 VITALS — BP 126/80 | HR 69 | Temp 98.1°F | Wt 140.0 lb

## 2017-01-17 DIAGNOSIS — R350 Frequency of micturition: Secondary | ICD-10-CM

## 2017-01-17 LAB — POC URINALSYSI DIPSTICK (AUTOMATED)
BILIRUBIN UA: NEGATIVE
Blood, UA: NEGATIVE
GLUCOSE UA: NEGATIVE
KETONES UA: NEGATIVE
Leukocytes, UA: NEGATIVE
Nitrite, UA: NEGATIVE
Protein, UA: NEGATIVE
SPEC GRAV UA: 1.025 (ref 1.010–1.025)
Urobilinogen, UA: 0.2 E.U./dL
pH, UA: 6 (ref 5.0–8.0)

## 2017-01-17 NOTE — Progress Notes (Signed)
   Subjective:    Patient ID: Seth Bates, male    DOB: Jun 12, 1980, 36 y.o.   MRN: [id number]  HPI This is a 36 yo male who presents today with nocturia x 2 months. He feels like he has to void frequently, small amounts. Gets up 6-7 times a night. Feels a little tingly at end of penis. In monogamous sexual relationship with wife.  No penile discharge, no rash or irritation. No decreased stream. No different back pain, hurts chronically. No nausea, vomiting, diarrhea, constipation, abdominal pain, fever. No gross blood. Has had strong smell to urine. Has not decreased liquid consumption. No increased hunger, thirst. No family history of diabetes. No fever/chills/night sweats.   Took Chantix in October, is off now. Continues to not smoke.   Sinus symptoms from 01/05/17 resolved.   Past Medical History:  Diagnosis Date  . Allergic rhinitis    No past surgical history on file. No family history on file. Social History   Tobacco Use  . Smoking status: Former Smoker    Packs/day: 0.25    Types: Cigarettes    Last attempt to quit: 11/01/2016    Years since quitting: 0.2  . Smokeless tobacco: Never Used  Substance Use Topics  . Alcohol use: No    Alcohol/week: 0.0 oz  . Drug use: No      Review of Systems Per HPI    Objective:   Physical Exam  Constitutional: He is oriented to person, place, and time. He appears well-developed and well-nourished. No distress.  HENT:  Head: Normocephalic and atraumatic.  Eyes: Conjunctivae are normal.  Cardiovascular: Normal rate, regular rhythm and normal heart sounds.  Pulmonary/Chest: Effort normal and breath sounds normal.  Abdominal: Soft. He exhibits no distension. There is no tenderness. There is no rebound and no guarding.  Genitourinary: Testes normal and penis normal. Circumcised. No phimosis, paraphimosis, hypospadias, penile erythema or penile tenderness. No discharge found.  Musculoskeletal: Normal range of motion.    Neurological: He is alert and oriented to person, place, and time.  Skin: Skin is warm and dry. He is not diaphoretic.  Psychiatric: He has a normal mood and affect. His behavior is normal. Judgment and thought content normal.      BP 126/80 (BP Location: Right Arm, Patient Position: Sitting, Cuff Size: Normal)   Pulse 69   Temp 98.1 F (36.7 C) (Oral)   Wt 140 lb (63.5 kg)   SpO2 97%   BMI 22.60 kg/m  Wt Readings from Last 3 Encounters:  01/17/17 140 lb (63.5 kg)  01/05/17 137 lb 12 oz (62.5 kg)  11/08/16 133 lb 6.4 oz (60.5 kg)   POCT urinalysis- unremarkable    Assessment & Plan:  Discussed with Dr. Damita Dunnings 1. Urine frequency - unclear etiology, will check labs/urine culture - POCT Urinalysis Dipstick (Automated) - CBC - Basic metabolic panel - Urine Culture   Clarene Reamer, FNP-BC  [facility], [facility]  01/20/2017 7:48 AM

## 2017-01-18 LAB — CBC
HEMATOCRIT: 48.2 % (ref 39.0–52.0)
HEMOGLOBIN: 16.5 g/dL (ref 13.0–17.0)
MCHC: 34.3 g/dL (ref 30.0–36.0)
MCV: 90.7 fl (ref 78.0–100.0)
PLATELETS: 344 10*3/uL (ref 150.0–400.0)
RBC: 5.31 Mil/uL (ref 4.22–5.81)
RDW: 12.8 % (ref 11.5–15.5)
WBC: 8.9 10*3/uL (ref 4.0–10.5)

## 2017-01-18 LAB — BASIC METABOLIC PANEL
BUN: 11 mg/dL (ref 6–23)
CALCIUM: 8.9 mg/dL (ref 8.4–10.5)
CHLORIDE: 100 meq/L (ref 96–112)
CO2: 29 meq/L (ref 19–32)
CREATININE: 0.95 mg/dL (ref 0.40–1.50)
GFR: 94.93 mL/min (ref >60.00)
GLUCOSE: 96 mg/dL (ref 70–99)
Potassium: 4.1 mEq/L (ref 3.5–5.1)
Sodium: 138 mEq/L (ref 135–145)

## 2017-01-18 LAB — URINE CULTURE
MICRO NUMBER:: 81415002
RESULT: NO GROWTH
SPECIMEN QUALITY: ADEQUATE

## 2017-01-20 ENCOUNTER — Encounter: Payer: Self-pay | Admitting: Family Medicine

## 2017-02-09 ENCOUNTER — Encounter: Payer: Self-pay | Admitting: Family Medicine

## 2017-02-09 ENCOUNTER — Ambulatory Visit (INDEPENDENT_AMBULATORY_CARE_PROVIDER_SITE_OTHER)
Admission: RE | Admit: 2017-02-09 | Discharge: 2017-02-09 | Disposition: A | Discharge status: Home / Self Care | Payer: BC Managed Care – PPO | Source: Ambulatory Visit | Attending: Family Medicine | Admitting: Family Medicine

## 2017-02-09 ENCOUNTER — Ambulatory Visit: Payer: BC Managed Care – PPO | Admitting: Family Medicine

## 2017-02-09 DIAGNOSIS — R81 Glycosuria: Secondary | ICD-10-CM

## 2017-02-09 DIAGNOSIS — Z87442 Personal history of urinary calculi: Secondary | ICD-10-CM

## 2017-02-09 DIAGNOSIS — R109 Unspecified abdominal pain: Secondary | ICD-10-CM | POA: Diagnosis not present

## 2017-02-09 DIAGNOSIS — R351 Nocturia: Secondary | ICD-10-CM | POA: Diagnosis not present

## 2017-02-09 LAB — POCT URINALYSIS DIPSTICK
BILIRUBIN UA: NEGATIVE
Blood, UA: NEGATIVE
Ketones, UA: NEGATIVE
LEUKOCYTES UA: NEGATIVE
Nitrite, UA: NEGATIVE
Protein, UA: NEGATIVE
SPEC GRAV UA: 1.02 (ref 1.010–1.025)
Urobilinogen, UA: 0.2 E.U./dL
pH, UA: 6.5 (ref 5.0–8.0)

## 2017-02-09 LAB — URINALYSIS, MICROSCOPIC ONLY
RBC / HPF: NONE SEEN (ref 0–?)
WBC, UA: NONE SEEN (ref 0–?)

## 2017-02-09 LAB — GLUCOSE, POCT (MANUAL RESULT ENTRY): POC GLUCOSE: 91 mg/dL (ref 70–99)

## 2017-02-09 MED ORDER — TAMSULOSIN HCL 0.4 MG PO CAPS: 0.4000 mg | ORAL_CAPSULE | Freq: Every day | ORAL | 3 refills | Status: DC

## 2017-02-09 NOTE — Addendum Note (Signed)
Addended by: Elmon Kirschner A on: 02/09/2017 12:12 PM   Modules accepted: Orders

## 2017-02-09 NOTE — Progress Notes (Signed)
poc glu

## 2017-02-09 NOTE — Patient Instructions (Signed)
Please stop and see Rosaria Ferries for scheduling your CT  If you develop worsening symptoms or fever over 101, please call the office

## 2017-02-09 NOTE — Progress Notes (Signed)
Subjective:    Patient ID: Seth Bates, male    DOB: Mar 14, 1980, 37 y.o.   MRN: [id number]  HPI This is a 37 yo male who presents today with continued nocturia- 2-3 times most nights. Has noticed some intermittent, sharp, fleeting right sided kidney pain. Occasional fleeting abdominal pain, not daily, some nausea over last couple of days. Occasionally feels like he does not have good urine flow. Has a history of kidney stones, imaging from 9/16 showed 3.6 mm stone on right. No penile or testicular pain, no discharge or incontinence. Occasional tingling as reported before.   Was seen 01/17/17 for these symptoms. UA, urine culture, CBC, BMP were all normal.   Past Medical History:  Diagnosis Date  . Allergic rhinitis    No past surgical history on file. No family history on file. Social History   Tobacco Use  . Smoking status: Former Smoker    Packs/day: 0.25    Types: Cigarettes    Last attempt to quit: 11/01/2016    Years since quitting: 0.2  . Smokeless tobacco: Never Used  Substance Use Topics  . Alcohol use: No    Alcohol/week: 0.0 oz  . Drug use: No      Review of Systems Per HPI    Objective:   Physical Exam Physical Exam  Vitals reviewed. Constitutional: Oriented to person, place, and time. Appears well-developed and well-nourished.  HENT:  Head: Normocephalic and atraumatic.  Eyes: Conjunctivae are normal.  Neck: Normal range of motion. Neck supple.  Cardiovascular: Normal rate.   Pulmonary/Chest: Effort normal.  Musculoskeletal: Normal range of motion.  Neurological: Alert and oriented to person, place, and time.  Skin: Skin is warm and dry.  Psychiatric: Normal mood and affect. Behavior is normal. Judgment and thought content normal.   BP 114/80 (BP Location: Right Arm, Patient Position: Sitting, Cuff Size: Normal)   Pulse 94   Temp 98.2 F (36.8 C) (Oral)   Wt 142 lb (64.4 kg)   SpO2 98%   BMI 22.92 kg/m  Wt Readings from Last 3  Encounters:  02/09/17 142 lb (64.4 kg)  01/17/17 140 lb (63.5 kg)  01/05/17 137 lb 12 oz (62.5 kg)   Results for orders placed or performed in visit on 02/09/17  POCT urinalysis dipstick  Result Value Ref Range   Color, UA yellow    Clarity, UA clear    Glucose, UA 1+    Bilirubin, UA neg    Ketones, UA neg    Spec Grav, UA 1.020 1.010 - 1.025   Blood, UA neg    pH, UA 6.5 5.0 - 8.0   Protein, UA neg    Urobilinogen, UA 0.2 0.2 or 1.0 E.U./dL   Nitrite, UA neg    Leukocytes, UA Negative Negative   Appearance     Odor    POC Glucose (CBG)  Result Value Ref Range   POC Glucose 91 70 - 99 mg/dl       Assessment & Plan:  1. Stomach pain - POCT urinalysis dipstick  2. Abdominal pain, unspecified abdominal location - CT RENAL STONE STUDY; Future  3. Right flank pain - high suspicion for recurrent stone, will check renal CT and start Flomax - CT RENAL STONE STUDY; Future - tamsulosin (FLOMAX) 0.4 MG CAPS capsule; Take 1 capsule (0.4 mg total) by mouth daily.  Dispense: 30 capsule; Refill: 3 - Ambulatory referral to Urology  4. Personal history of kidney stones - CT RENAL STONE STUDY; Future -  tamsulosin (FLOMAX) 0.4 MG CAPS capsule; Take 1 capsule (0.4 mg total) by mouth daily.  Dispense: 30 capsule; Refill: 3 - Ambulatory referral to Urology  5. Glucose found in urine on examination - POC Glucose (CBG)  6. Nocturia - Ambulatory referral to Urology   Clarene Reamer, FNP-BC  [facility]  02/09/2017 9:46 AM

## 2019-04-12 ENCOUNTER — Other Ambulatory Visit: Payer: Self-pay

## 2019-04-12 ENCOUNTER — Encounter: Payer: Self-pay | Admitting: Family Medicine

## 2019-04-12 ENCOUNTER — Ambulatory Visit: Payer: BC Managed Care – PPO | Admitting: Family Medicine

## 2019-04-12 VITALS — BP 120/86 | HR 90 | Temp 98.3°F | Ht 65.75 in | Wt 149.8 lb

## 2019-04-12 DIAGNOSIS — N50812 Left testicular pain: Secondary | ICD-10-CM | POA: Diagnosis not present

## 2019-04-12 DIAGNOSIS — M549 Dorsalgia, unspecified: Secondary | ICD-10-CM | POA: Diagnosis not present

## 2019-04-12 DIAGNOSIS — N50811 Right testicular pain: Secondary | ICD-10-CM

## 2019-04-12 MED ORDER — PREDNISONE 20 MG PO TABS: ORAL_TABLET | ORAL | 0 refills | Status: DC

## 2019-04-12 NOTE — Progress Notes (Signed)
[name], MD Primary Care and [place] at [facility] [place], [address] Phone: [phone]  FAX: [place] - 39 y.o. male  MRN [id number]  Date of Birth: February 06, 1980  Visit Date: 04/12/2019  PCP: Abner Greenspan, MD  Referred by: Tower, Wynelle Fanny, MD  Chief Complaint  Patient presents with  . Back Pain    This visit occurred during the SARS-CoV-2 public health emergency.  Safety protocols were in place, including screening questions prior to the visit, additional usage of staff PPE, and extensive cleaning of exam room while observing appropriate contact time as indicated for disinfecting solutions.   Subjective:   Seth Bates is a 39 y.o. very pleasant male patient who presents with the following:  He is a pleasant guy.  I actually saw him about 6 years ago for some back pain.  He presents today with some complaints of an acute on chronic low back pain.  He does not really have any significant radicular symptoms.  At this point he is not really working out at all.  He has been taking some Tylenol as well as Aleve without much significant relief of symptoms with his current flare.  At this point he is having some pain most days of the week.  He does work with heavy equipment, but this does not require him to personally lift much.  He also is quite active and does feel trials with his [place], and he also likes to fish a lot. He is taking some Flexeril at night right now, this is up from his baseline.  He also does note that his testicles are little bit painful today  Review of Systems is noted in the HPI, as appropriate  Objective:   BP 120/86   Pulse 90   Temp 98.3 F (36.8 C) (Temporal)   Ht 5' 5.75" (1.67 m)   Wt 149 lb 12 oz (67.9 kg)   SpO2 98%   BMI 24.35 kg/m   GEN: No acute distress; alert,appropriate. PULM: Breathing comfortably in no respiratory  distress PSYCH: Normally interactive.    GEN: No acute distress; alert,appropriate. PULM: Breathing comfortably in no respiratory distress PSYCH: Normally interactive.   Range of motion at  the waist: Flexion: normal Extension: normal Lateral bending: normal Rotation: all normal  No echymosis or edema Rises to examination table with no difficulty Gait: non antalgic  Inspection/Deformity: N Paraspinus Tenderness: Mild from L4-S1 bilaterally  B Ankle Dorsiflexion (L5,4): 5/5 B Great Toe Dorsiflexion (L5,4): 5/5 Heel Walk (L5): WNL Toe Walk (S1): WNL Rise/Squat (L4): WNL  SENSORY B Medial Foot (L4): WNL B Dorsum (L5): WNL B Lateral (S1): WNL Light Touch: WNL Pinprick: WNL  REFLEXES Knee (L4): 2+ Ankle (S1): 2+  B SLR, seated: neg B SLR, supine: neg B FABER: neg B Reverse FABER: neg B Greater Troch: NT B Log Roll: neg B Stork: NT B Sciatic Notch: NT  GU: Normal male phallus.  Testicles are grossly nontender to palpation.  There is no mass.  Grossly nontender at the epididymis.  Laboratory and Imaging Data:  Assessment and Plan:     ICD-10-CM   1. Acute back pain, unspecified back location, unspecified back pain laterality  M54.9   2. Pain in both testicles  N50.811    N50.812    He does have some back pain, acute on chronic.  I am get a pulse him with 14  days of some steroids.  Continue with Flexeril at nighttime.  Reviewed basic range of motion, stretching, modest strengthening when able.  I reassured him about his testicles, there is nothing abnormal on his exam.  There is no pain on exam.  I do not think that there is really anything to worry about unless it becomes persistent or worsens.  He has no known potential STD contact.  Follow-up: No follow-ups on file.  Meds ordered this encounter  Medications  . predniSONE (DELTASONE) 20 MG tablet    Sig: 2 tabs po for 7 days, then 1 tab po for 7 days    Dispense:  21 tablet    Refill:  0   Medications  Discontinued During This Encounter  Medication Reason  . tamsulosin (FLOMAX) 0.4 MG CAPS capsule Completed Course   No orders of the defined types were placed in this encounter.   Signed,  Maud Deed. Naja Apperson, MD   Outpatient Encounter Medications as of 04/12/2019  Medication Sig  . cyclobenzaprine (FLEXERIL) 10 MG tablet Take 10 mg by mouth at bedtime as needed for muscle spasms.  . fluticasone (FLONASE) 50 MCG/ACT nasal spray Place 2 sprays into both nostrils daily.  . Ibuprofen-Famotidine (DUEXIS) 800-26.6 MG TABS 1 tab po tid X 14 days then 1 tab po tid as needed  . predniSONE (DELTASONE) 20 MG tablet 2 tabs po for 7 days, then 1 tab po for 7 days  . [DISCONTINUED] tamsulosin (FLOMAX) 0.4 MG CAPS capsule Take 1 capsule (0.4 mg total) by mouth daily.   No facility-administered encounter medications on file as of 04/12/2019.

## 2019-04-18 ENCOUNTER — Ambulatory Visit: Payer: BC Managed Care – PPO | Admitting: Gastroenterology

## 2019-04-18 ENCOUNTER — Other Ambulatory Visit: Payer: Self-pay

## 2019-04-18 ENCOUNTER — Encounter: Payer: Self-pay | Admitting: Gastroenterology

## 2019-04-18 VITALS — BP 126/80 | HR 111 | Temp 97.4°F | Ht 66.0 in | Wt 142.0 lb

## 2019-04-18 DIAGNOSIS — R152 Fecal urgency: Secondary | ICD-10-CM

## 2019-04-18 DIAGNOSIS — R1084 Generalized abdominal pain: Secondary | ICD-10-CM | POA: Diagnosis not present

## 2019-04-18 MED ORDER — GLYCOPYRROLATE 2 MG PO TABS: 2.0000 mg | ORAL_TABLET | Freq: Two times a day (BID) | ORAL | 11 refills | Status: DC

## 2019-04-18 NOTE — Patient Instructions (Signed)
We have sent the following medications to your pharmacy for you to pick up at your convenience: glycopyrrolate.   Thank you for choosing me and [facility].  Pricilla Riffle. Dagoberto Ligas., MD., Marval Regal

## 2019-04-18 NOTE — Progress Notes (Signed)
History of Present Illness: This is a 39 year old male referred by Tower, Wynelle Fanny, MD for the evaluation of abdominal pain, diarrhea. He was evaluated in 2016 for similar symptoms.  Evaluation in 2016 included CBC, CMP, ESR, CRP, tTG, IgA, stool culture, C. difficile assay and colonoscopy, all were unremarkable.  He relates his symptoms resolved after the colonoscopy however they returned and have been bothersome for the past year.  About 90% of the mornings he will have generalized abdominal discomfort associated with frequent urgent bowel movements. Symptoms generally end around 11 am, without problems the rest of the day.  He notes no dietary stressors.  He was treated with ranitidine and hyoscyamine in 2016 and he relates one of the medications led to headaches. He discontinued both medications. Denies weight loss, constipation, change in stool caliber, melena, hematochezia, nausea, vomiting, dysphagia, reflux symptoms, chest pain.     No Known Allergies Outpatient Medications Prior to Visit  Medication Sig Dispense Refill  . cyclobenzaprine (FLEXERIL) 10 MG tablet Take 10 mg by mouth at bedtime as needed for muscle spasms.    . fluticasone (FLONASE) 50 MCG/ACT nasal spray Place 2 sprays into both nostrils daily. 16 g 6  . Ibuprofen-Famotidine (DUEXIS) 800-26.6 MG TABS 1 tab po tid X 14 days then 1 tab po tid as needed 90 tablet 2  . predniSONE (DELTASONE) 20 MG tablet 2 tabs po for 7 days, then 1 tab po for 7 days 21 tablet 0   No facility-administered medications prior to visit.   Past Medical History:  Diagnosis Date  . Allergic rhinitis    History reviewed. No pertinent surgical history. Social History   Socioeconomic History  . Marital status: Single    Spouse name: Not on file  . Number of children: 2  . Years of education: Not on file  . Highest education level: Not on file  Occupational History  . Occupation: Teacher, adult education  Tobacco Use  . Smoking status: Former  Smoker    Packs/day: 0.25    Types: Cigarettes    Quit date: 11/01/2016    Years since quitting: 2.4  . Smokeless tobacco: Never Used  Substance and Sexual Activity  . Alcohol use: No    Alcohol/week: 0.0 standard drinks  . Drug use: No  . Sexual activity: Not on file  Other Topics Concern  . Not on file  Social History Narrative  . Not on file   Social Determinants of Health   Financial Resource Strain:   . Difficulty of Paying Living Expenses:   Food Insecurity:   . Worried About Charity fundraiser in the Last Year:   . Arboriculturist in the Last Year:   Transportation Needs:   . Film/video editor (Medical):   Marland Kitchen Lack of Transportation (Non-Medical):   Physical Activity:   . Days of Exercise per Week:   . Minutes of Exercise per Session:   Stress:   . Feeling of Stress :   Social Connections:   . Frequency of Communication with Friends and Family:   . Frequency of Social Gatherings with Friends and Family:   . Attends Religious Services:   . Active Member of Clubs or Organizations:   . Attends Archivist Meetings:   Marland Kitchen Marital Status:    History reviewed. No pertinent family history.     Review of Systems: Pertinent positive and negative review of systems were noted in the above HPI section. All other  review of systems were otherwise negative.    Physical Exam: General: Well developed, well nourished, no acute distress Head: Normocephalic and atraumatic Eyes:  sclerae anicteric, EOMI Ears: Normal auditory acuity Mouth: Not examined, mask on during Covid-19 pandemic Neck: Supple, no masses or thyromegaly Lungs: Clear throughout to auscultation Heart: Regular rate and rhythm; no murmurs, rubs or bruits Abdomen: Soft, minimal periumbilical tenderness and non distended. No masses, hepatosplenomegaly or hernias noted. Normal Bowel sounds Rectal: Not done Musculoskeletal: Symmetrical with no gross deformities  Skin: No lesions on visible  extremities Pulses:  Normal pulses noted Extremities: No clubbing, cyanosis, edema or deformities noted Neurological: Alert oriented x 4, grossly nonfocal Cervical Nodes:  No significant cervical adenopathy Inguinal Nodes: No significant inguinal adenopathy Psychological:  Alert and cooperative. Normal mood and affect   Assessment and Recommendations:  1.  Suspected IBS-D.  Begin glycopyrrolate 2 mg p.o. twice daily.  If he has an inadequate response consider reevaluation and/or medication changes. REV in 6 weeks.   cc: Abner Greenspan, MD [address] [place],  [place] [address]

## 2019-04-19 ENCOUNTER — Ambulatory Visit: Payer: BC Managed Care – PPO | Admitting: Family Medicine

## 2019-04-19 ENCOUNTER — Encounter: Payer: Self-pay | Admitting: Family Medicine

## 2019-04-19 ENCOUNTER — Other Ambulatory Visit: Payer: Self-pay

## 2019-04-19 ENCOUNTER — Telehealth: Payer: Self-pay

## 2019-04-19 VITALS — BP 128/94 | HR 88 | Temp 98.2°F | Resp 10 | Wt 156.0 lb

## 2019-04-19 DIAGNOSIS — H579 Unspecified disorder of eye and adnexa: Secondary | ICD-10-CM | POA: Diagnosis not present

## 2019-04-19 DIAGNOSIS — H538 Other visual disturbances: Secondary | ICD-10-CM

## 2019-04-19 NOTE — Progress Notes (Signed)
Subjective:     Seth Bates is a 39 y.o. male presenting for Blurred Vision (and eye pressure. Started on 04/16/19. Started to take prednsione on 04/13/19)     HPI  #Blurry vision - started prednisone on 3/12 for back pain - noticed vision changes on 3/15 - both eyes blurry - feels like there pressure in the eyes, also some pain - took some tylenol for the eye pain w/o improvement - no increased pain with eye movement - this morning both eyes were "blood shot red" but this has improved - distance is blurry but close vision is fine - feels like it is progressively worsening - no eye injury - no family hx of vision issues - does not have glasses/contacts but has noticed some decline prior to this but not as abrupt   Endorses chronic allergy symptoms   Review of Systems   Social History   Tobacco Use  Smoking Status Former Smoker  . Packs/day: 0.25  . Types: Cigarettes  . Quit date: 11/01/2016  . Years since quitting: 2.4  Smokeless Tobacco Never Used        Objective:    BP Readings from Last 3 Encounters:  04/19/19 (!) 128/94  04/18/19 126/80  04/12/19 120/86   Wt Readings from Last 3 Encounters:  04/19/19 156 lb (70.8 kg)  04/18/19 142 lb (64.4 kg)  04/12/19 149 lb 12 oz (67.9 kg)    BP (!) 128/94   Pulse 88   Temp 98.2 F (36.8 C)   Resp 10   Wt 156 lb (70.8 kg)   SpO2 99%   BMI 25.18 kg/m    Physical Exam Constitutional:      Appearance: Normal appearance. He is not ill-appearing or diaphoretic.  HENT:     Head: Normocephalic and atraumatic.     Right Ear: Tympanic membrane and external ear normal.     Left Ear: Tympanic membrane and external ear normal.     Nose: Nose normal.  Eyes:     General: Lids are normal. Vision grossly intact. No visual field deficit or scleral icterus.       Right eye: No discharge.        Left eye: No discharge.     Extraocular Movements: Extraocular movements intact.     Conjunctiva/sclera:  Conjunctivae normal.     Right eye: Right conjunctiva is not injected.     Left eye: Left conjunctiva is not injected.     Visual Fields: Right eye visual fields normal and left eye visual fields normal.  Cardiovascular:     Rate and Rhythm: Normal rate and regular rhythm.     Heart sounds: No murmur.  Pulmonary:     Effort: Pulmonary effort is normal. No respiratory distress.     Breath sounds: Normal breath sounds.  Musculoskeletal:     Cervical back: Neck supple.  Skin:    General: Skin is warm and dry.  Neurological:     Mental Status: He is alert. Mental status is at baseline.  Psychiatric:        Mood and Affect: Mood normal.      Hearing Screening   125Hz  250Hz  500Hz  1000Hz  2000Hz  3000Hz  4000Hz  6000Hz  8000Hz   Right ear:           Left ear:             Visual Acuity Screening   Right eye Left eye Both eyes  Without correction: 20/40 20/30 20/25   With correction:  Comments: Saw both colors correct       Assessment & Plan:   Problem List Items Addressed This Visit    None    Visit Diagnoses    Blurry vision, bilateral    -  Primary   Relevant Orders   Ambulatory referral to Ophthalmology   Eye pressure       Relevant Orders   Ambulatory referral to Ophthalmology     Reassuring vision test in the office, however, given onset of symptoms per patient and eye pain/pressure in setting of recent course of prednisone will refer to Ophthalmology for urgent evaluation   Return for after the eye doctor visit.  Lesleigh Noe, MD

## 2019-04-19 NOTE — Telephone Encounter (Signed)
[facility] - Client TELEPHONE ADVICE RECORD AccessNurse Patient Name: Seth Bates Gender: Male DOB: Dec 07, 1980 Age: 39 Y 6 D Return Phone Number: [phone] (Primary) Address: City/State/Zip: [place] [address] Client Seth Bates - Client Client Site [place] MD Contact Type Call Who Is Calling Patient / Member / Family / Caregiver Call Type Triage / Clinical Relationship To Patient Self Return Phone Number [phone] (Primary) Chief Complaint Eye Pain Reason for Call Symptomatic / Request for Roberts, 02/12/1980. On prednisone now has blurred vision and eye pressure 2 pills a Bates started it friday morning monday or tuesday 40mg  a Bates Translation No Nurse Assessment Nurse: Juleen China, RN, Butch Penny Date/Time Eilene Ghazi Time): 04/19/2019 9:08:36 AM Confirm and document reason for call. If symptomatic, describe symptoms. ---Seth Bates, [birth year] for back pain. Prescribed prednisone and is having has blurred vision and eye pressure that started Monday. No fever, cough or SOB. Has the patient had close contact with a person known or suspected to have the novel coronavirus illness OR traveled / lives in area with major community spread (including international travel) in the last 14 days from the onset of symptoms? * If Asymptomatic, screen for exposure and travel within the last 14 days. ---No Does the patient have any new or worsening symptoms? ---Yes Will a triage be completed? ---Yes Related visit to physician within the last 2 weeks? ---Yes Does the PT have any chronic conditions? (i.e. diabetes, asthma, this includes High risk factors for pregnancy, etc.) ---No Is this a behavioral health or substance abuse call? ---No Guidelines Guideline Title Affirmed Question Affirmed Notes  Nurse Date/Time Eilene Ghazi Time) Eye Pain [1] Blurred vision AND [2] new or worsening Criselda Peaches 04/19/2019 9:10:42 AM Disp. Time Eilene Ghazi Time) Disposition Final User 04/19/2019 9:13:30 AM Go to ED Now (or PCP triage) Yes Juleen China, RN, Annitta Jersey NOTE: All timestamps contained within this report are represented as [place] Standard Time. CONFIDENTIALTY NOTICE: This fax transmission is intended only for the addressee. It contains information that is legally privileged, confidential or otherwise protected from use or disclosure. If you are not the intended recipient, you are strictly prohibited from reviewing, disclosing, copying using or disseminating any of this information or taking any action in reliance on or regarding this information. If you have received this fax in error, please notify us immediately by telephone so that we can arrange for its return to [place]. Phone: [phone], Toll-Free: [phone], Fax: [phone] Page: 2 of 2 Call Id: [id number] [place] Disagree/Comply Comply Caller Understands Yes PreDisposition Call Doctor Care Advice Given Per Guideline GO TO ED NOW (OR PCP TRIAGE): * IF NO PCP (PRIMARY CARE PROVIDER) SECOND-LEVEL TRIAGE: You need to be seen within the next hour. Go to the [facility] at _____________ [place] as soon as you can. ANOTHER ADULT SHOULD DRIVE: * It is better and safer if another adult drives instead of you. CALL BACK IF: * You become worse. CARE ADVICE given per Eye Pain (Adult) guideline. Comments User: Jennye Moccasin, RN Date/Time Eilene Ghazi Time): 04/19/2019 9:16:54 AM Transferred to office to schedule an appointment within the hour. Is taking prednisone and has blurred vision. Advised not to take another dose until sees a Dr. Prednisone was given for back pain. User: Jennye Moccasin, RN Date/Time Eilene Ghazi Time): 04/19/2019 9:17:21 AM If no appointment available will go to UC. Referrals GO TO FACILITY UNDECIDED

## 2019-04-19 NOTE — Patient Instructions (Signed)
You should hear from someone today about scheduling with the eye doctor. If you have worsening eye pressure or pain please call our office or consider going to the ER.

## 2019-04-19 NOTE — Telephone Encounter (Signed)
See encounter from today. Urgent Ophthalmology referral

## 2019-04-19 NOTE — Telephone Encounter (Signed)
Pt had office visit on 04/12/19; pt started prednisone on 04/13/19; pt cannot remember if taken prednisone before; on 04/16/19 or 04/17/19 pt started with blurred vision; occasional lightheadedness that only last for a second; last lightheadedness was 04/18/19.No H/A,CP, or SOB.Pt has no covid symptoms, no travel and no known exposure to + covid. This morning pt said that his eyes are red this morning and feel irritated, no itching or drainage. Pt was triaged by access nurse and was advised to be seen in office or UC within the hour. Pt scheduled in office appt with Dr Einar Pheasant this morning at 10 AM. Access nurse note is not in portal yet and will give faxed copy to [facility] CMA.

## 2019-05-23 ENCOUNTER — Encounter: Payer: Self-pay | Admitting: Gastroenterology

## 2019-05-23 ENCOUNTER — Ambulatory Visit: Payer: BC Managed Care – PPO | Admitting: Gastroenterology

## 2019-05-23 VITALS — BP 108/78 | HR 82 | Temp 97.7°F | Ht 66.0 in | Wt 154.2 lb

## 2019-05-23 DIAGNOSIS — K580 Irritable bowel syndrome with diarrhea: Secondary | ICD-10-CM | POA: Diagnosis not present

## 2019-05-23 DIAGNOSIS — R1084 Generalized abdominal pain: Secondary | ICD-10-CM

## 2019-05-23 MED ORDER — DICYCLOMINE HCL 10 MG PO CAPS: 10.0000 mg | ORAL_CAPSULE | Freq: Three times a day (TID) | ORAL | 11 refills | Status: DC | PRN

## 2019-05-23 NOTE — Patient Instructions (Signed)
We have sent the following medications to your pharmacy for you to pick up at your convenience: dicyclomine.   Normal BMI (Body Mass Index- based on height and weight) is between 19 and 25. Your BMI today is Body mass index is 24.9 kg/m. Marland Kitchen Please consider follow up  regarding your BMI with your Primary Care Provider.  Thank you for choosing me and [facility].  Pricilla Riffle. Dagoberto Ligas., MD., Marval Regal

## 2019-05-23 NOTE — Progress Notes (Signed)
    History of Present Illness: This is a 39 year old male returning for follow-up of abdominal pain and diarrhea.  Glycopyrrolate did help reduce his symptoms however after a week or 2 on medication he developed abdominal distention, dizziness, sweating and felt flushed.  He decreased glycopyrrolate to once daily however his symptoms persisted and he discontinued medication.  Current Medications, Allergies, Past Medical History, Past Surgical History, Family History and Social History were reviewed in [employer] record.  Physical Exam: General: Well developed, well nourished, no acute distress Head: Normocephalic and atraumatic Eyes:  sclerae anicteric, EOMI Ears: Normal auditory acuity Mouth: Not examined, mask on during Covid-19 pandemic Lungs: Clear throughout to auscultation Heart: Regular rate and rhythm; no murmurs, rubs or bruits Abdomen: Soft, non tender and non distended. No masses, hepatosplenomegaly or hernias noted. Normal Bowel sounds Rectal: Not done Musculoskeletal: Symmetrical with no gross deformities  Pulses:  Normal pulses noted Extremities: No clubbing, cyanosis, edema or deformities noted Neurological: Alert oriented x 4, grossly nonfocal Psychological:  Alert and cooperative. Normal mood and affect   Assessment and Recommendations:  1.  IBS-D.  Side effects from glycopyrrolate so it was discontinued.  Dicyclomine 10 mg po tid prn.  Avoid foods that trigger symptoms.  Call if symptoms do not respond or side effects develop.  REV in 6 weeks.

## 2019-07-17 ENCOUNTER — Ambulatory Visit: Payer: BC Managed Care – PPO | Admitting: Gastroenterology

## 2019-07-17 ENCOUNTER — Encounter: Payer: Self-pay | Admitting: Gastroenterology

## 2019-07-17 VITALS — BP 106/70 | HR 76 | Ht 65.5 in | Wt 152.4 lb

## 2019-07-17 DIAGNOSIS — K580 Irritable bowel syndrome with diarrhea: Secondary | ICD-10-CM

## 2019-07-17 NOTE — Patient Instructions (Signed)
Stay on dicyclomine three times a day as needed.   Thank you for choosing me and [facility].  Pricilla Riffle. Dagoberto Ligas., MD., Marval Regal

## 2019-07-17 NOTE — Progress Notes (Signed)
    History of Present Illness: This is a 39 year old male with intermittent abdominal pain and diarrhea . He had side effects from glycopyrrolate so it was discontinued.  He tolerates dicyclomine which is effective in reducing symptoms.  He states he is generally takes dicyclomine once daily.  He had periumbilical abdominal pain that lasted for a few hours while on vacation last week and he states he did not have dicyclomine with him while he was out of town.  Current Medications, Allergies, Past Medical History, Past Surgical History, Family History and Social History were reviewed in [employer] record.  Physical Exam: General: Well developed, well nourished, no acute distress Head: Normocephalic and atraumatic Eyes:  sclerae anicteric, EOMI Ears: Normal auditory acuity Mouth: Not examined, mask on during Covid-19 pandemic Lungs: Clear throughout to auscultation Heart: Regular rate and rhythm; no murmurs, rubs or bruits Abdomen: Soft, non tender and non distended. No masses, hepatosplenomegaly or hernias noted. Normal Bowel sounds Rectal: Not done Musculoskeletal: Symmetrical with no gross deformities  Pulses:  Normal pulses noted Extremities: No clubbing, cyanosis, edema or deformities noted Neurological: Alert oriented x 4, grossly nonfocal Psychological:  Alert and cooperative. Normal mood and affect   Assessment and Recommendations:  1.  IBS-D.  Avoid foods that trigger symptoms.  Continue dicyclomine 10 mg p.o. 3 times daily as needed.  REV in 1 year.

## 2020-01-11 ENCOUNTER — Encounter: Payer: Self-pay | Admitting: Family Medicine

## 2020-01-11 ENCOUNTER — Ambulatory Visit: Payer: BC Managed Care – PPO | Admitting: Family Medicine

## 2020-01-11 ENCOUNTER — Telehealth: Payer: Self-pay

## 2020-01-11 ENCOUNTER — Other Ambulatory Visit: Payer: Self-pay

## 2020-01-11 VITALS — BP 120/88 | HR 89 | Temp 98.1°F | Wt 153.2 lb

## 2020-01-11 DIAGNOSIS — M5441 Lumbago with sciatica, right side: Secondary | ICD-10-CM | POA: Diagnosis not present

## 2020-01-11 MED ORDER — DICLOFENAC SODIUM 75 MG PO TBEC: 75.0000 mg | DELAYED_RELEASE_TABLET | Freq: Two times a day (BID) | ORAL | 1 refills | Status: DC

## 2020-01-11 MED ORDER — CYCLOBENZAPRINE HCL 10 MG PO TABS: 10.0000 mg | ORAL_TABLET | Freq: Every evening | ORAL | 1 refills | Status: DC | PRN

## 2020-01-11 NOTE — Patient Instructions (Signed)
Acute Back Pain, Adult Acute back pain is sudden and usually short-lived. It is often caused by an injury to the muscles and tissues in the back. The injury may result from:  A muscle or ligament getting overstretched or torn (strained). Ligaments are tissues that connect bones to each other. Lifting something improperly can cause a back strain.  Wear and tear (degeneration) of the spinal disks. Spinal disks are circular tissue that provides cushioning between the bones of the spine (vertebrae).  Twisting motions, such as while playing sports or doing yard work.  A hit to the back.  Arthritis. You may have a physical exam, lab tests, and imaging tests to find the cause of your pain. Acute back pain usually goes away with rest and home care. Follow these instructions at home: Managing pain, stiffness, and swelling  Take over-the-counter and prescription medicines only as told by your health care provider.  Your health care provider may recommend applying ice during the first 24-48 hours after your pain starts. To do this: ? Put ice in a plastic bag. ? Place a towel between your skin and the bag. ? Leave the ice on for 20 minutes, 2-3 times a day.  If directed, apply heat to the affected area as often as told by your health care provider. Use the heat source that your health care provider recommends, such as a moist heat pack or a heating pad. ? Place a towel between your skin and the heat source. ? Leave the heat on for 20-30 minutes. ? Remove the heat if your skin turns bright red. This is especially important if you are unable to feel pain, heat, or cold. You have a greater risk of getting burned. Activity   Do not stay in bed. Staying in bed for more than 1-2 days can delay your recovery.  Sit up and stand up straight. Avoid leaning forward when you sit, or hunching over when you stand. ? If you work at a desk, sit close to it so you do not need to lean over. Keep your chin tucked  in. Keep your neck drawn back, and keep your elbows bent at a right angle. Your arms should look like the letter "L." ? Sit high and close to the steering wheel when you drive. Add lower back (lumbar) support to your car seat, if needed.  Take short walks on even surfaces as soon as you are able. Try to increase the length of time you walk each day.  Do not sit, drive, or stand in one place for more than 30 minutes at a time. Sitting or standing for long periods of time can put stress on your back.  Do not drive or use heavy machinery while taking prescription pain medicine.  Use proper lifting techniques. When you bend and lift, use positions that put less stress on your back: ? Bend your knees. ? Keep the load close to your body. ? Avoid twisting.  Exercise regularly as told by your health care provider. Exercising helps your back heal faster and helps prevent back injuries by keeping muscles strong and flexible.  Work with a physical therapist to make a safe exercise program, as recommended by your health care provider. Do any exercises as told by your physical therapist. Lifestyle  Maintain a healthy weight. Extra weight puts stress on your back and makes it difficult to have good posture.  Avoid activities or situations that make you feel anxious or stressed. Stress and anxiety increase muscle   tension and can make back pain worse. Learn ways to manage anxiety and stress, such as through exercise. General instructions  Sleep on a firm mattress in a comfortable position. Try lying on your side with your knees slightly bent. If you lie on your back, put a pillow under your knees.  Follow your treatment plan as told by your health care provider. This may include: ? Cognitive or behavioral therapy. ? Acupuncture or massage therapy. ? Meditation or yoga. Contact a health care provider if:  You have pain that is not relieved with rest or medicine.  You have increasing pain going down  into your legs or buttocks.  Your pain does not improve after 2 weeks.  You have pain at night.  You lose weight without trying.  You have a fever or chills. Get help right away if:  You develop new bowel or bladder control problems.  You have unusual weakness or numbness in your arms or legs.  You develop nausea or vomiting.  You develop abdominal pain.  You feel faint. Summary  Acute back pain is sudden and usually short-lived.  Use proper lifting techniques. When you bend and lift, use positions that put less stress on your back.  Take over-the-counter and prescription medicines and apply heat or ice as directed by your health care provider. This information is not intended to replace advice given to you by your health care provider. Make sure you discuss any questions you have with your health care provider. Document Revised: 05/09/2018 Document Reviewed: 09/01/2016 Elsevier Patient Education  2020 [employer]  

## 2020-01-11 NOTE — Progress Notes (Signed)
   Subjective:    Patient ID: Seth Bates, male    DOB: 1980/02/12, 39 y.o.   MRN: [id number]  HPI Chief Complaint  Patient presents with  . Back Pain    Lumbar X 1 week     This is a 39 yo male who presents today for above cc. He  has a past medical history of Allergic rhinitis. He reports back pain x 10 years, worse over the last week, no known trigger. Pain sharp, burning, around tailbone, occasionally down right leg, right buttock. No urinary or bowel changes. Pain constant all day, able to sleep with cyclobenzaprine. Has been taking 2-3 ibuprofen 2x/ day wit some relief. Heavy lifting and twisting/ bending aggravate symptoms. Has done exercises and saw a chiropractor without improvement. Reports prior MRI but I do not see in EMR.  Has not seen ortho. Has taken prednisone in past, last time it made him feel bad.   Review of Systems Per HPI    Objective:   Physical Exam Vitals reviewed.  Constitutional:      Appearance: Normal appearance. He is normal weight.  HENT:     Head: Normocephalic and atraumatic.  Cardiovascular:     Rate and Rhythm: Normal rate and regular rhythm.     Heart sounds: Normal heart sounds.  Musculoskeletal:        General: No swelling or tenderness.     Cervical back: Normal range of motion and neck supple.     Comments: Normal gait. UE/LE strength 5/5. Negative straight leg raise.   Skin:    General: Skin is warm and dry.  Neurological:     Mental Status: He is alert and oriented to person, place, and time.     Deep Tendon Reflexes: Reflexes normal.  Psychiatric:        Mood and Affect: Mood normal.        Behavior: Behavior normal.        Thought Content: Thought content normal.        Judgment: Judgment normal.       BP 120/88   Pulse 89   Temp 98.1 F (36.7 C) (Temporal)   Wt 153 lb 4 oz (69.5 kg)   SpO2 99%   BMI 25.11 kg/m  Wt Readings from Last 3 Encounters:  01/11/20 153 lb 4 oz (69.5 kg)  07/17/19 152 lb 6 oz (69.1  kg)  05/23/19 154 lb 4 oz (70 kg)       Assessment & Plan:  1. Acute midline low back pain with right-sided sciatica - no worrisome findings on history or PE.  - will have him stop otc ibuprofen, try diclofenac, continue heat, refill cyclobenzaprine - follow up precautions reviewed - Ambulatory referral to Orthopedic Surgery - cyclobenzaprine (FLEXERIL) 10 MG tablet; Take 1 tablet (10 mg total) by mouth at bedtime as needed for muscle spasms.  Dispense: 30 tablet; Refill: 1 - diclofenac (VOLTAREN) 75 MG EC tablet; Take 1 tablet (75 mg total) by mouth 2 (two) times daily.  Dispense: 30 tablet; Refill: 1  This visit occurred during the SARS-CoV-2 public health emergency.  Safety protocols were in place, including screening questions prior to the visit, additional usage of staff PPE, and extensive cleaning of exam room while observing appropriate contact time as indicated for disinfecting solutions.    Clarene Reamer, FNP-BC  [facility]  01/11/2020 10:39 AM

## 2020-01-11 NOTE — Telephone Encounter (Signed)
Per appt notes pt has already had appt today with Glenda Chroman FNP.

## 2020-01-11 NOTE — Telephone Encounter (Signed)
[facility] - Client TELEPHONE ADVICE RECORD [facility] Patient Name: Seth Bates Gender: Male DOB: [birth year] Age: 39 Y 108 M 28 D Return Phone Number: [phone] (Primary) [place] [address] Client [facility] - Client Client Site [facility] Contact Type Call Who Is Calling Patient / Member / Family / Caregiver Call Type Triage / Clinical Relationship To Patient Self Return Phone Number [phone] (Primary) Chief Complaint Back Pain - General Reason for Call Symptomatic / Request for [place] states that he has back pain. He has an appointment to see Dr. Glori Bickers today. Translation No Nurse Assessment Nurse: Raenette Rover, RN, Zella Ball Date/Time (Eastern Time): 01/11/2020 8:49:27 AM Confirm and document reason for call. If symptomatic, describe symptoms. ---Caller states that he has back pain. He has an appointment to see Dr. Glori Bickers today. Hurts all the time and now having a burning in the lower back. Able to walk and rates pain 7-8/10 Has taken flexeril and advil for the pain. Helps some. Does the patient have any new or worsening symptoms? ---Yes Will a triage be completed? ---Yes Related visit to physician within the last 2 weeks? ---No Does the PT have any chronic conditions? (i.e. diabetes, asthma, this includes High risk factors for pregnancy, etc.) ---No Is this a behavioral health or substance abuse call? ---No Guidelines Guideline Title Affirmed Question Affirmed Notes Nurse Date/Time (Eastern Time) Back Pain [1] SEVERE abdominal pain AND [2] present > 1 hour Raenette Rover, RN, Zella Ball 01/11/2020 8:51:41 AM Disp. Time Eilene Ghazi Time) Disposition Final User 01/11/2020 8:58:46 AM Go to ED Now (or PCP triage) Yes Raenette Rover, RN, Herbert Deaner Disagree/Comply Comply Caller Understands Yes PreDisposition Did not know what to do Care  Advice Given Per Guideline PLEASE NOTE: All timestamps contained within this report are represented as [place] Standard Time. CONFIDENTIALTY NOTICE: This fax transmission is intended only for the addressee. It contains information that is legally privileged, confidential or otherwise protected from use or disclosure. If you are not the intended recipient, you are strictly prohibited from reviewing, disclosing, copying using or disseminating any of this information or taking any action in reliance on or regarding this information. If you have received this fax in error, please notify us immediately by telephone so that we can arrange for its return to [place]. Phone: [phone], Toll-Free: [phone], Fax: [phone] Page: 2 of 2 Call Id: [id number] Care Advice Given Per Guideline GO TO ED NOW (OR PCP TRIAGE): CARE ADVICE given per Back Pain (Adult) guideline. ANOTHER ADULT SHOULD DRIVE: Comments User: Wilson Singer, RN Date/Time (Eastern Time): 01/11/2020 8:52:01 AM Pain first started 10 years ago but not sure if its from an injury or not. User: Wilson Singer, RN Date/Time Eilene Ghazi Time): 01/11/2020 8:54:23 AM Took 3 ibuprofren for the pain at 7 am and it hasn't helped. User: Wilson Singer, RN Date/Time Eilene Ghazi Time): 01/11/2020 9:00:00 AM Patient states he is not going to go to the ER. Called back lineof office and they said to have him come in at 10 am to the office. That's in one hour. Advised call and caller is in agreement. Referrals REFERRED TO PCP OFFICE

## 2020-01-22 ENCOUNTER — Ambulatory Visit: Payer: BC Managed Care – PPO | Admitting: Orthopaedic Surgery

## 2020-01-29 ENCOUNTER — Ambulatory Visit: Payer: Self-pay

## 2020-01-29 ENCOUNTER — Other Ambulatory Visit: Payer: Self-pay

## 2020-01-29 ENCOUNTER — Ambulatory Visit: Payer: BC Managed Care – PPO | Admitting: Orthopaedic Surgery

## 2020-01-29 ENCOUNTER — Encounter: Payer: Self-pay | Admitting: Orthopaedic Surgery

## 2020-01-29 VITALS — BP 131/90 | HR 75 | Ht 65.5 in | Wt 153.2 lb

## 2020-01-29 DIAGNOSIS — M5442 Lumbago with sciatica, left side: Secondary | ICD-10-CM | POA: Diagnosis not present

## 2020-01-29 DIAGNOSIS — G8929 Other chronic pain: Secondary | ICD-10-CM

## 2020-01-29 DIAGNOSIS — M25511 Pain in right shoulder: Secondary | ICD-10-CM

## 2020-01-29 DIAGNOSIS — M5441 Lumbago with sciatica, right side: Secondary | ICD-10-CM | POA: Diagnosis not present

## 2020-01-29 NOTE — Progress Notes (Signed)
Office Visit Note   Patient: Seth Bates           Date of Birth: 1980/03/20           MRN: [id number] Visit Date: 01/29/2020              Requested by: Emi Belfast, FNP No address on file PCP: Judy Pimple, MD   Assessment & Plan: Visit Diagnoses:  1. Chronic bilateral low back pain with bilateral sciatica     Plan: We will set patient up for some physical therapy.  He likely has some disc protrusion worse on the right than left with intermittent episodic symptoms ,variable intensity.  Recheck 5 weeks.  Follow-Up Instructions: Return in about 5 weeks (around 03/04/2020).   Orders:  Orders Placed This Encounter  Procedures  . XR Lumbar Spine 2-3 Views  . Ambulatory referral to Physical Therapy   No orders of the defined types were placed in this encounter.     Procedures: No procedures performed   Clinical Data: No additional findings.   Subjective: Chief Complaint  Patient presents with  . Lower Back - Pain    HPI 39 year old male with chronic back pain x10 years worse over the last few weeks he is used diclofenac Flexeril and heat.  Sometimes he does not have as much pain other times is worse it radiates from his back into both legs worse on the right leg and left.  No current numbness in his feet.  Sometimes he has numbness and pain that radiate past the knee.  He is not been through any therapy.  No associated bowel bladder symptoms no previous surgeries.  He does smoke he said previous problems with shoulder joint instability.  Review of Systems all other systems noncontributory to HPI.   Objective: Vital Signs: BP 131/90   Pulse 75   Ht 5' 5.5" (1.664 m)   Wt 153 lb 4 oz (69.5 kg)   BMI 25.11 kg/m   Physical Exam Constitutional:      Appearance: He is well-developed and well-nourished.  HENT:     Head: Normocephalic and atraumatic.  Eyes:     Extraocular Movements: EOM normal.     Pupils: Pupils are equal, round, and reactive to  light.  Neck:     Thyroid: No thyromegaly.     Trachea: No tracheal deviation.  Cardiovascular:     Rate and Rhythm: Normal rate.  Pulmonary:     Effort: Pulmonary effort is normal.     Breath sounds: No wheezing.  Abdominal:     General: Bowel sounds are normal.     Palpations: Abdomen is soft.  Skin:    General: Skin is warm and dry.     Capillary Refill: Capillary refill takes less than 2 seconds.  Neurological:     Mental Status: He is alert and oriented to person, place, and time.  Psychiatric:        Mood and Affect: Mood and affect normal.        Behavior: Behavior normal.        Thought Content: Thought content normal.        Judgment: Judgment normal.     Ortho Exam negative straight leg raising minimal sciatic notch tenderness.  Negative popliteal compression test.  Some tenderness lumbosacral junction anterior tib EHL heel toe walking is normal.  Specialty Comments:  No specialty comments available.  Imaging: XR Lumbar Spine 2-3 Views  Result Date: 01/30/2020 AP and  lateral lumbar spine x-rays are obtained and reviewed.  This shows normal alignment negative for acute changes.  Maintained disc space height. Impression: Normal lumbar spine radiographs.    PMFS History: Patient Active Problem List   Diagnosis Date Noted  . Left upper arm pain 10/06/2016  . Gastritis 07/09/2014  . Diarrhea 02/05/2014  . Abdominal cramping 02/05/2014  . Potential for corneal abrasion 08/13/2013  . Right otitis externa 07/18/2013  . Acute bacterial sinusitis 04/17/2013  . Vertigo 02/07/2012  . Allergic rhinitis   . Enteritis 10/21/2011  . 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 TOBACCO ABUSE 04/23/2008   Past Medical History:  Diagnosis Date  . Allergic rhinitis     History reviewed. No pertinent family history.  History reviewed. No pertinent surgical history. Social History   Occupational History  . Occupation: Air traffic controller  Tobacco Use  . Smoking status:  Former Smoker    Packs/day: 0.25    Types: Cigarettes    Quit date: 11/01/2016    Years since quitting: 3.2  . Smokeless tobacco: Never Used  Vaping Use  . Vaping Use: Never used  Substance and Sexual Activity  . Alcohol use: No    Alcohol/week: 0.0 standard drinks  . Drug use: No  . Sexual activity: Not on file

## 2020-02-13 ENCOUNTER — Encounter: Payer: Self-pay | Admitting: Rehabilitative and Restorative Service Providers"

## 2020-02-13 ENCOUNTER — Other Ambulatory Visit: Payer: Self-pay

## 2020-02-13 ENCOUNTER — Ambulatory Visit (INDEPENDENT_AMBULATORY_CARE_PROVIDER_SITE_OTHER): Payer: BC Managed Care – PPO | Admitting: Rehabilitative and Restorative Service Providers"

## 2020-02-13 DIAGNOSIS — G8929 Other chronic pain: Secondary | ICD-10-CM

## 2020-02-13 DIAGNOSIS — M5441 Lumbago with sciatica, right side: Secondary | ICD-10-CM

## 2020-02-13 DIAGNOSIS — M6281 Muscle weakness (generalized): Secondary | ICD-10-CM | POA: Diagnosis not present

## 2020-02-13 DIAGNOSIS — M79604 Pain in right leg: Secondary | ICD-10-CM | POA: Diagnosis not present

## 2020-02-13 NOTE — Therapy (Signed)
[place], [place], [address] Phone: [phone]   Fax:  [phone]  Physical Therapy Evaluation  Patient Details  Name: Seth Bates MRN: [id number] Date of Birth: Jun 26, 1980 Referring Provider (PT): Dr. Lorin Mercy   Encounter Date: 02/13/2020   PT End of Session - 02/13/20 1436    Visit Number 1    Number of Visits 12    Date for PT Re-Evaluation 04/16/20    Progress Note Due on Visit 10    PT Start Time 1436    PT Stop Time 1510    PT Time Calculation (min) 34 min    Activity Tolerance Patient tolerated treatment well    Behavior During Therapy [facility] for tasks assessed/performed           Past Medical History:  Diagnosis Date  . Allergic rhinitis     History reviewed. No pertinent surgical history.  There were no vitals filed for this visit.    Subjective Assessment - 02/13/20 1436    Subjective Acute on chronic low back pain worsened over last month.  History of complaints off and on for about 10 years or so.  No history of therapy, denied bowel or bladder control issues. Pt. indicated back pain and also on Rt posterior hip and thigh.   Pt. stated pain is various day to day at times.  Lifting, bending, twisting can aggravate symptoms.  Pt. stated hurting so bad a times and can't walk.    Patient Stated Goals Reduce pain    Currently in Pain? Yes    Pain Score 7    pain at worst   Pain Location Back    Pain Orientation Lower    Pain Type Other (Comment)   acute on chronic   Pain Radiating Towards Rt upper thigh/buttock    Aggravating Factors  bending, lifting, twisting    Pain Relieving Factors heat, rest, medicine    Effect of Pain on Daily Activities Limited in activity of day when pain is bad.              [facility] PT Assessment - 02/13/20 0001      Assessment   Medical Diagnosis Low back pain bilateral sciatica    Referring Provider (PT) Dr. Lorin Mercy    Onset Date/Surgical Date 01/16/20     Hand Dominance Right      Precautions   Precautions None      Restrictions   Weight Bearing Restrictions No      Balance Screen   Has the patient fallen in the past 6 months No      [place] residence      Prior Function   Vocation Full time employment    [employer]    Leisure fishing, outdoor activity      Cognition   Overall Cognitive Status Within Functional Limits for tasks assessed      Observation/Other Assessments   Focus on Therapeutic Outcomes (FOTO)  intake 49%, expected outcome 66%      Sensation   Light Touch Appears Intact      Posture/Postural Control   Posture Comments Unremarkable in standing      Deep Tendon Reflexes   DTR Assessment Site Patella    Patella DTR 2+      ROM / Strength   AROM / PROM / Strength Strength;PROM;AROM      AROM   Overall AROM Comments No centralization/peripherialization noted (no LE  present at all during visit)    AROM Assessment Site Lumbar    Lumbar Flexion movement to ankle with no complaints    Lumbar Extension 75 % c ERP lumbar.  REIS x 5 ERP produced   no leg symptoms noted   Lumbar - Right Side Bend centered LBP, knee jt    Lumbar - Left Side Bend centered LBP, knee jt      PROM   Overall PROM Comments no complaints c hip IR/ER performed in prone.  IR limited mildly compared to norms but equal bilateral      Strength   Strength Assessment Site Hip;Knee;Ankle    Right/Left Hip Left;Right    Right Hip Flexion 5/5    Right Hip Extension 4/5    Left Hip Flexion 5/5    Left Hip Extension 5/5    Right/Left Knee Left;Right    Right Knee Flexion 5/5    Right Knee Extension 5/5    Left Knee Flexion 5/5    Left Knee Extension 5/5    Right/Left Ankle Left;Right    Right Ankle Dorsiflexion 5/5    Left Ankle Dorsiflexion 5/5      Palpation   Spinal mobility Mild limitation L3-L5 cPA      Special Tests   Other special tests (-) slump for LE  symptoms, positive for central LBP                      Objective measurements completed on examination: See above findings.       [place] Adult PT Treatment/Exercise - 02/13/20 0001      Exercises   Exercises Lumbar;Other Exercises    Other Exercises  HEP instruction/performance c cues for techniques.  Performed one set of each exercise for comprehension and cues.  Education also performed about activity management c pain monitoring model description based off symptom presentation.  Education for promoting aerobic exercise. HEP consisting of lumbar extension standing, prone press up, sidelying regional rotation stretch, supine bridge      Manual Therapy   Manual therapy comments cPA g3 L2-L5, Rt sidelying regional rotation mob g3                  PT Education - 02/13/20 1436    Education Details HEP, POC    Person(s) Educated Patient    Methods Demonstration;Explanation;Handout;Verbal cues    Comprehension Verbalized understanding;Returned demonstration            PT Short Term Goals - 02/13/20 1437      PT SHORT TERM GOAL #1   Title Patient will demonstrate independent use of home exercise program to maintain progress from in clinic treatments.    Time 3    Period Weeks    Status New    Target Date 03/05/20      PT SHORT TERM GOAL #2   Period Weeks    Status New             PT Long Term Goals - 02/13/20 1518      PT LONG TERM GOAL #1   Title Patient will demonstrate/report pain at worst less than or equal to 2/10 to facilitate minimal limitation in daily activity secondary to pain symptoms.    Time 8    Period Weeks    Status New    Target Date 04/09/20      PT LONG TERM GOAL #2   Title Patient will demonstrate independent use of home exercise program  to facilitate ability to maintain/progress functional gains from skilled physical therapy services.    Time 8    Period Weeks    Status New    Target Date 04/09/20      PT LONG TERM  GOAL #3   Title Patient will demonstrate return to work/recreational activity at previous level of function without limitations secondary due to condition with FOTO outcome > or = 66%    Time 8    Period Weeks    Status New    Target Date 04/09/20      PT LONG TERM GOAL #4   Title Patient will demonstrate lumbar extension 100 % WFL s symptoms to facilitate upright standing, walking posture at PLOF s limitation.    Time 8    Period Weeks    Status New    Target Date 04/09/20      PT LONG TERM GOAL #5   Title Pt. will demonstrate bilateral hip MMT 5/5 throughout to facilitate usual recreational and work activity at [employer].    Time 8    Period Weeks    Status New    Target Date 04/09/20                  Plan - 02/13/20 1437    Clinical Impression Statement Patient is a 40 y.o. male who comes to clinic with complaints of low back pain c Rt LE pain with mobility deficits primary that impair their ability to perform usual daily and recreational functional activities without increase difficulty/symptoms at this time.  Patient to benefit from skilled PT services to address impairments and limitations to improve to previous level of function without restriction secondary to condition.    Examination-Activity Limitations Bend;Sleep;Squat;Lift;Reach Overhead    Examination-Participation Restrictions Other;Occupation;Community Activity;Yard Work   exercise   Stability/Clinical Decision Making Stable/Uncomplicated    Clinical Decision Making Low    Rehab Potential Good    PT Frequency --   1-2x/week   PT Duration 8 weeks    PT Treatment/Interventions ADLs/Self Care Home Management;Electrical Stimulation;Iontophoresis 4mg /ml Dexamethasone;Moist Heat;Traction;Balance training;Therapeutic exercise;Therapeutic activities;Functional mobility training;Stair training;Gait training;DME Instruction;Ultrasound;Neuromuscular re-education;Patient/family education;Manual techniques;Taping;Dry  needling;Passive range of motion;Spinal Manipulations;Joint Manipulations    PT Next Visit Plan Manual for improved mobility/pain relief.  Progressive strengthening hip/core    PT Home Exercise Plan [email or web address]    Consulted and Agree with Plan of Care Patient           Patient will benefit from skilled therapeutic intervention in order to improve the following deficits and impairments:  Decreased endurance,Hypomobility,Pain,Decreased strength,Decreased activity tolerance,Decreased mobility,Improper body mechanics,Impaired perceived functional ability,Impaired flexibility,Decreased range of motion  Visit Diagnosis: Chronic bilateral low back pain with right-sided sciatica  Muscle weakness (generalized)  Pain in right leg     Problem List Patient Active Problem List   Diagnosis Date Noted  . Left upper arm pain 10/06/2016  . Gastritis 07/09/2014  . Diarrhea 02/05/2014  . Abdominal cramping 02/05/2014  . Potential for corneal abrasion 08/13/2013  . Right otitis externa 07/18/2013  . Acute bacterial sinusitis 04/17/2013  . Vertigo 02/07/2012  . Allergic rhinitis   . Enteritis 10/21/2011  . 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 TOBACCO ABUSE 04/23/2008    Scot Jun, PT, DPT, OCS, ATC 02/13/20  3:20 PM    [facility] [address] [place], [place], [address] Phone: [phone]   Fax:  [phone]  Name: Seth Bates MRN: [id number] Date of Birth: 1980/12/20

## 2020-02-13 NOTE — Patient Instructions (Signed)
Access Code: [email or web address] URL: [email or web address] Date: 02/13/2020 Prepared by: Scot Jun  Exercises Prone Press Up - 2 x daily - 7 x weekly - 1 sets - 10 reps - 1-2 hold Standing Lumbar Extension - 2 x daily - 7 x weekly - 1 sets - 10 reps Supine Bridge - 2 x daily - 7 x weekly - 3 sets - 10 reps - 2 hold Sidelying Lumbar Rotation Stretch - 2 x daily - 7 x weekly - 1 sets - 5 reps - 15 hold

## 2020-02-27 ENCOUNTER — Ambulatory Visit (INDEPENDENT_AMBULATORY_CARE_PROVIDER_SITE_OTHER): Payer: BC Managed Care – PPO | Admitting: Rehabilitative and Restorative Service Providers"

## 2020-02-27 ENCOUNTER — Encounter: Payer: Self-pay | Admitting: Rehabilitative and Restorative Service Providers"

## 2020-02-27 ENCOUNTER — Other Ambulatory Visit: Payer: Self-pay

## 2020-02-27 DIAGNOSIS — M6281 Muscle weakness (generalized): Secondary | ICD-10-CM

## 2020-02-27 DIAGNOSIS — M5441 Lumbago with sciatica, right side: Secondary | ICD-10-CM | POA: Diagnosis not present

## 2020-02-27 DIAGNOSIS — G8929 Other chronic pain: Secondary | ICD-10-CM

## 2020-02-27 DIAGNOSIS — M79604 Pain in right leg: Secondary | ICD-10-CM | POA: Diagnosis not present

## 2020-02-27 NOTE — Patient Instructions (Signed)
Access Code: [email or web address] URL: [email or web address] Date: 02/27/2020 Prepared by: Scot Jun  Exercises Prone Press Up - 2 x daily - 7 x weekly - 1 sets - 10 reps - 1-2 hold Standing Lumbar Extension - 2 x daily - 7 x weekly - 1 sets - 10 reps Supine Bridge - 2 x daily - 7 x weekly - 3 sets - 10 reps - 2 hold Sidelying Lumbar Rotation Stretch - 2 x daily - 7 x weekly - 1 sets - 5 reps - 15 hold Plank on Knees - 1 x daily - 7 x weekly - 1 sets - 5 reps - to fatigue hold Bird Dog - 1 x daily - 7 x weekly - 1 sets - 15 reps - 3 hold

## 2020-02-27 NOTE — Therapy (Signed)
[place], [place], [address] Phone: [phone]   Fax:  [phone]  Physical Therapy Treatment  Patient Details  Name: Seth Bates MRN: [id number] Date of Birth: [birth year] Referring Provider (PT): Dr. Lorin Mercy   Encounter Date: 02/27/2020   PT End of Session - 02/27/20 1445    Visit Number 2    Number of Visits 12    Date for PT Re-Evaluation 04/16/20    Progress Note Due on Visit 10    PT Start Time 9628    PT Stop Time 1513    PT Time Calculation (min) 39 min    Activity Tolerance Patient tolerated treatment well    Behavior During Therapy [facility] for tasks assessed/performed           Past Medical History:  Diagnosis Date  . Allergic rhinitis     History reviewed. No pertinent surgical history.  There were no vitals filed for this visit.   Subjective Assessment - 02/27/20 1437    Subjective Pt. indicated 5/10 pain at worst, feeling some better overall but still pain at times.  No pain upon arrival today.    Patient Stated Goals Reduce pain    Currently in Pain? No/denies                             [facility] Adult PT Treatment/Exercise - 02/27/20 0001      Lumbar Exercises: Stretches   Other Lumbar Stretch Exercise lumbar rotation stretch 15 sec x 3 bilateral      Lumbar Exercises: Aerobic   Recumbent Bike Lvl 5 10 mins      Lumbar Exercises: Supine   Bridge 10 reps;5 seconds;Compliant    Single Leg Bridge 3 seconds;10 reps;Compliant   bilateral     Lumbar Exercises: Prone   Other Prone Lumbar Exercises planks on knees 45 sec x 3    Other Prone Lumbar Exercises qruped arm/leg lift birddog 3 sec hold x 10 each side      Manual Therapy   Manual therapy comments cPA g3 L2-L5                  PT Education - 02/27/20 1448    Education Details HEP update    Person(s) Educated Patient    Methods Explanation;Handout;Verbal cues    Comprehension Verbalized  understanding            PT Short Term Goals - 02/27/20 1450      PT SHORT TERM GOAL #1   Title Patient will demonstrate independent use of home exercise program to maintain progress from in clinic treatments.    Time 3    Period Weeks    Status On-going    Target Date 03/05/20             PT Long Term Goals - 02/13/20 1518      PT LONG TERM GOAL #1   Title Patient will demonstrate/report pain at worst less than or equal to 2/10 to facilitate minimal limitation in daily activity secondary to pain symptoms.    Time 8    Period Weeks    Status New    Target Date 04/09/20      PT LONG TERM GOAL #2   Title Patient will demonstrate independent use of home exercise program to facilitate ability to maintain/progress functional gains from skilled physical therapy services.    Time 8    Period  Weeks    Status New    Target Date 04/09/20      PT LONG TERM GOAL #3   Title Patient will demonstrate return to work/recreational activity at previous level of function without limitations secondary due to condition with FOTO outcome > or = 66%    Time 8    Period Weeks    Status New    Target Date 04/09/20      PT LONG TERM GOAL #4   Title Patient will demonstrate lumbar extension 100 % WFL s symptoms to facilitate upright standing, walking posture at PLOF s limitation.    Time 8    Period Weeks    Status New    Target Date 04/09/20      PT LONG TERM GOAL #5   Title Pt. will demonstrate bilateral hip MMT 5/5 throughout to facilitate usual recreational and work activity at [employer].    Time 8    Period Weeks    Status New    Target Date 04/09/20                 Plan - 02/27/20 1450    Clinical Impression Statement Improved intervertebral mobility noted in assessment today as well as improved active lumbar movement c reduced symptoms.  Core stability and control to be improved to help improve functional movement such as lifting/carrying.    Examination-Activity Limitations  Bend;Sleep;Squat;Lift;Reach Overhead    Examination-Participation Restrictions Other;Occupation;Community Activity;Yard Work   exercise   Stability/Clinical Decision Making Stable/Uncomplicated    Rehab Potential Good    PT Frequency --   1-2x/week   PT Duration 8 weeks    PT Treatment/Interventions ADLs/Self Care Home Management;Electrical Stimulation;Iontophoresis 4mg /ml Dexamethasone;Moist Heat;Traction;Balance training;Therapeutic exercise;Therapeutic activities;Functional mobility training;Stair training;Gait training;DME Instruction;Ultrasound;Neuromuscular re-education;Patient/family education;Manual techniques;Taping;Dry needling;Passive range of motion;Spinal Manipulations;Joint Manipulations    PT Next Visit Plan Manual for improved mobility/pain relief.  Progressive strengthening hip/core    PT Home Exercise Plan [email or web address]    Consulted and Agree with Plan of Care Patient           Patient will benefit from skilled therapeutic intervention in order to improve the following deficits and impairments:  Decreased endurance,Hypomobility,Pain,Decreased strength,Decreased activity tolerance,Decreased mobility,Improper body mechanics,Impaired perceived functional ability,Impaired flexibility,Decreased range of motion  Visit Diagnosis: Chronic bilateral low back pain with right-sided sciatica  Muscle weakness (generalized)  Pain in right leg     Problem List Patient Active Problem List   Diagnosis Date Noted  . Left upper arm pain 10/06/2016  . Gastritis 07/09/2014  . Diarrhea 02/05/2014  . Abdominal cramping 02/05/2014  . Potential for corneal abrasion 08/13/2013  . Right otitis externa 07/18/2013  . Acute bacterial sinusitis 04/17/2013  . Vertigo 02/07/2012  . Allergic rhinitis   . Enteritis 10/21/2011  . 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 TOBACCO ABUSE 04/23/2008    Scot Jun, PT, DPT, OCS, ATC 02/27/20  3:02 PM    [facility] [address] [place], [place], [address] Phone: [phone]   Fax:  [phone]  Name: Seth Bates MRN: [id number] Date of Birth: April 26, 1980

## 2020-02-29 ENCOUNTER — Encounter: Payer: Self-pay | Admitting: Rehabilitative and Restorative Service Providers"

## 2020-03-04 ENCOUNTER — Encounter: Payer: Self-pay | Admitting: Orthopaedic Surgery

## 2020-03-04 ENCOUNTER — Ambulatory Visit (INDEPENDENT_AMBULATORY_CARE_PROVIDER_SITE_OTHER): Payer: BC Managed Care – PPO | Admitting: Orthopaedic Surgery

## 2020-03-04 VITALS — BP 152/110 | HR 74 | Ht 66.0 in | Wt 150.0 lb

## 2020-03-04 DIAGNOSIS — M5416 Radiculopathy, lumbar region: Secondary | ICD-10-CM | POA: Diagnosis not present

## 2020-03-04 DIAGNOSIS — G8929 Other chronic pain: Secondary | ICD-10-CM | POA: Insufficient documentation

## 2020-03-04 NOTE — Progress Notes (Signed)
Office Visit Note   Patient: Seth Bates           Date of Birth: 12-Jan-1981           MRN: [id number] Visit Date: 03/04/2020              Requested by: Tower, Wynelle Fanny, MD [place],   [id number] PCP: Abner Greenspan, MD   Assessment & Plan: Visit Diagnoses:  1. Radiculopathy, lumbar region   Chronic low back pain and right lower extremity radiculopathy  Plan: Patient continues to have ongoing low back pain and lower extremity radiculopathy that is failed conservative treatment I will proceed with scheduling lumbar MRI to rule out HNP/stenosis.  Patient will follow-up in 2 weeks with Dr. Lorin Mercy for recheck and to discuss results and further treatment options.  Advised patient that we will hold off on his PT visits until he returns back to see Dr. Lorin Mercy to review his study.  Dr. Lorin Mercy can make decision at that time as to whether or not he needs to resume PT.  Follow-Up Instructions: Return in about 2 weeks (around 03/18/2020) for With Dr. Lorin Mercy to review lumbar MRI.   Orders:  Orders Placed This Encounter  Procedures  . MR Lumbar Spine w/o contrast   No orders of the defined types were placed in this encounter.     Procedures: No procedures performed   Clinical Data: No additional findings.   Subjective: Chief Complaint  Patient presents with  . Lower Back - Pain, Follow-up    HPI 40 year old white male returns recheck of his low back pain and lower extremity radiculopathy.  States that he continues have ongoing symptoms.  No improvement with PT up to this point.  States that pain radiates more down the right lower extremity.  He also feels like he has episodes of pain radiating into his testicles.  He has not had imaging with an MRI.  Today patient states that he is actually had ongoing problems off and on for at least 14 years but this has been worse over the last several months. Review of Systems No current cardiac pulmonary GI  GU  Objective: Vital Signs: BP (!) 152/110   Pulse 74   Ht 5\' 6"  (1.676 m)   Wt 150 lb (68 kg)   BMI 24.21 kg/m   Physical Exam HENT:     Head: Normocephalic.  Pulmonary:     Effort: No respiratory distress.  Musculoskeletal:     Comments: Gait is normal.  Patient has good lumbar flexion extension but with some discomfort.  Negative logroll bilateral hips.  Positive right straight leg raise.  Negative on the left side.  He has trace right gastroc weakness.  Bilateral calves nontender.  Neurological:     Mental Status: He is alert and oriented to person, place, and time.     Ortho Exam  Specialty Comments:  No specialty comments available.  Imaging: No results found.   PMFS History: Patient Active Problem List   Diagnosis Date Noted  . Chronic bilateral low back pain with bilateral sciatica 03/04/2020  . Left upper arm pain 10/06/2016  . Gastritis 07/09/2014  . Diarrhea 02/05/2014  . Abdominal cramping 02/05/2014  . Potential for corneal abrasion 08/13/2013  . Right otitis externa 07/18/2013  . Acute bacterial sinusitis 04/17/2013  . Vertigo 02/07/2012  . Allergic rhinitis   . Enteritis 10/21/2011  . 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 TOBACCO ABUSE 04/23/2008  Past Medical History:  Diagnosis Date  . Allergic rhinitis     No family history on file.  No past surgical history on file. Social History   Occupational History  . Occupation: Teacher, adult education  Tobacco Use  . Smoking status: Former Smoker    Packs/day: 0.25    Types: Cigarettes    Quit date: 11/01/2016    Years since quitting: 3.3  . Smokeless tobacco: Never Used  Vaping Use  . Vaping Use: Never used  Substance and Sexual Activity  . Alcohol use: No    Alcohol/week: 0.0 standard drinks  . Drug use: No  . Sexual activity: Not on file

## 2020-03-05 ENCOUNTER — Ambulatory Visit (INDEPENDENT_AMBULATORY_CARE_PROVIDER_SITE_OTHER): Payer: BC Managed Care – PPO | Admitting: Family Medicine

## 2020-03-05 ENCOUNTER — Encounter: Payer: Self-pay | Admitting: Family Medicine

## 2020-03-05 ENCOUNTER — Encounter: Payer: Self-pay | Admitting: Rehabilitative and Restorative Service Providers"

## 2020-03-05 ENCOUNTER — Other Ambulatory Visit: Payer: Self-pay

## 2020-03-05 VITALS — BP 150/92 | HR 82 | Temp 97.3°F | Ht 65.0 in | Wt 156.6 lb

## 2020-03-05 DIAGNOSIS — I10 Essential (primary) hypertension: Secondary | ICD-10-CM | POA: Diagnosis not present

## 2020-03-05 DIAGNOSIS — R519 Headache, unspecified: Secondary | ICD-10-CM | POA: Diagnosis not present

## 2020-03-05 DIAGNOSIS — G8929 Other chronic pain: Secondary | ICD-10-CM | POA: Insufficient documentation

## 2020-03-05 LAB — CBC WITH DIFFERENTIAL/PLATELET
Basophils Absolute: 0 10*3/uL (ref 0.0–0.1)
Basophils Relative: 0.5 % (ref 0.0–3.0)
Eosinophils Absolute: 0.3 10*3/uL (ref 0.0–0.7)
Eosinophils Relative: 3.6 % (ref 0.0–5.0)
HCT: 50.6 % (ref 39.0–52.0)
Hemoglobin: 17.2 g/dL — ABNORMAL HIGH (ref 13.0–17.0)
Lymphocytes Relative: 30.2 % (ref 12.0–46.0)
Lymphs Abs: 2.7 10*3/uL (ref 0.7–4.0)
MCHC: 33.9 g/dL (ref 30.0–36.0)
MCV: 88.5 fl (ref 78.0–100.0)
Monocytes Absolute: 0.7 10*3/uL (ref 0.1–1.0)
Monocytes Relative: 7.7 % (ref 3.0–12.0)
Neutro Abs: 5.1 10*3/uL (ref 1.4–7.7)
Neutrophils Relative %: 58 % (ref 43.0–77.0)
Platelets: 373 10*3/uL (ref 150.0–400.0)
RBC: 5.72 Mil/uL (ref 4.22–5.81)
RDW: 13.1 % (ref 11.5–15.5)
WBC: 8.8 10*3/uL (ref 4.0–10.5)

## 2020-03-05 LAB — COMPREHENSIVE METABOLIC PANEL
ALT: 76 U/L — ABNORMAL HIGH (ref 0–53)
AST: 33 U/L (ref 0–37)
Albumin: 4.8 g/dL (ref 3.5–5.2)
Alkaline Phosphatase: 83 U/L (ref 39–117)
BUN: 11 mg/dL (ref 6–23)
CO2: 32 mEq/L (ref 19–32)
Calcium: 9.6 mg/dL (ref 8.4–10.5)
Chloride: 100 mEq/L (ref 96–112)
Creatinine, Ser: 1.05 mg/dL (ref 0.40–1.50)
GFR: 89.17 mL/min (ref >60.00)
Glucose, Bld: 92 mg/dL (ref 70–99)
Potassium: 4.3 mEq/L (ref 3.5–5.1)
Sodium: 138 mEq/L (ref 135–145)
Total Bilirubin: 0.8 mg/dL (ref 0.2–1.2)
Total Protein: 7.2 g/dL (ref 6.0–8.3)

## 2020-03-05 LAB — LIPID PANEL
Cholesterol: 250 mg/dL — ABNORMAL HIGH (ref 0–200)
HDL: 33.5 mg/dL — ABNORMAL LOW (ref >39.00)
NonHDL: 216.59
Total CHOL/HDL Ratio: 7
Triglycerides: 352 mg/dL — ABNORMAL HIGH (ref 0.0–149.0)
VLDL: 70.4 mg/dL — ABNORMAL HIGH (ref 0.0–40.0)

## 2020-03-05 LAB — TSH: TSH: 1.62 u[IU]/mL (ref 0.35–4.50)

## 2020-03-05 LAB — LDL CHOLESTEROL, DIRECT: Direct LDL: 143 mg/dL

## 2020-03-05 MED ORDER — METOPROLOL SUCCINATE ER 50 MG PO TB24: 50.0000 mg | ORAL_TABLET | Freq: Every day | ORAL | 1 refills | Status: DC

## 2020-03-05 NOTE — Patient Instructions (Addendum)
Trade soda and juice for water any chance you get  Eat less processed foods (DASH eating plan) Don't smoke   Take metoprolol xl 50 mg  It helps blood pressure and chronic headaches  If you feel dizzy- let us know (or any other side effects)   Try saline nasal spray for sinuses  Keep using flonase also   Follow up in about 2 weeks

## 2020-03-05 NOTE — Assessment & Plan Note (Signed)
Persistent elevated blood pressure with h/o chronic headaches  Disc HTN and why we treat it Disc lifestyle change in detail-handout given DASH eating and HTN Adv more water/less soda  Exercise-maximize if able Px metoprolol xl 50 mg daily (may also help chronic headaches)  Labs today (look for 2nd cause or end organ damage) F/u about 2 weeks

## 2020-03-05 NOTE — Progress Notes (Signed)
Subjective:    Patient ID: Seth Bates, male    DOB: 05/17/1980, 40 y.o.   MRN: [id number]  This visit occurred during the SARS-CoV-2 public health emergency.  Safety protocols were in place, including screening questions prior to the visit, additional usage of staff PPE, and extensive cleaning of exam room while observing appropriate contact time as indicated for disinfecting solutions.    HPI Pt presents with concerns about blood pressure   Wt Readings from Last 3 Encounters:  03/05/20 156 lb 9 oz (71 kg)  03/04/20 150 lb (68 kg)  01/29/20 153 lb 4 oz (69.5 kg)   26.05 kg/m    BP was 152/110 at orthopedic office yesterday   BP Readings from Last 3 Encounters:  03/05/20 (!) 150/92  03/04/20 (!) 152/110  01/29/20 131/90      Has never had BP problems before  Has headaches - from sinuses all the time  No flushing or ankles swelling or chest pain    Pulse Readings from Last 3 Encounters:  03/05/20 82  03/04/20 74  01/29/20 75   Used to smoke-quit 3-4 y ago   No HTN in the family   Does not exercise  Not a lot of processed foods    Sinus/cough medicine -chronic  Alka selzer product (does not think it has sudafed)  Also delsym   No steroids recently  Takes voltaren etoh-none   Gets tested for covid weekly  Had covid a year ago   Patient Active Problem List   Diagnosis Date Noted  . Hypertension 03/05/2020  . Chronic bilateral low back pain with bilateral sciatica 03/04/2020  . Left upper arm pain 10/06/2016  . Gastritis 07/09/2014  . Diarrhea 02/05/2014  . Abdominal cramping 02/05/2014  . Potential for corneal abrasion 08/13/2013  . Right otitis externa 07/18/2013  . Vertigo 02/07/2012  . Allergic rhinitis   . Enteritis 10/21/2011  . SHOULDER JOINT INSTABILITY 08/29/2008   Past Medical History:  Diagnosis Date  . Allergic rhinitis    History reviewed. No pertinent surgical history. Social History   Tobacco Use  . Smoking status:  Former Smoker    Packs/day: 0.25    Types: Cigarettes    Quit date: 11/01/2016    Years since quitting: 3.3  . Smokeless tobacco: Never Used  Vaping Use  . Vaping Use: Never used  Substance Use Topics  . Alcohol use: No    Alcohol/week: 0.0 standard drinks  . Drug use: No   History reviewed. No pertinent family history. Allergies  Allergen Reactions  . Robinul [Glycopyrrolate]     Dizzy, abdominal distention   Current Outpatient Medications on File Prior to Visit  Medication Sig Dispense Refill  . cyclobenzaprine (FLEXERIL) 10 MG tablet Take 1 tablet (10 mg total) by mouth at bedtime as needed for muscle spasms. 30 tablet 1  . diclofenac (VOLTAREN) 75 MG EC tablet Take 1 tablet (75 mg total) by mouth 2 (two) times daily. 30 tablet 1  . dicyclomine (BENTYL) 10 MG capsule Take 1 capsule (10 mg total) by mouth 3 (three) times daily as needed for spasms. 90 capsule 11  . fluticasone (FLONASE) 50 MCG/ACT nasal spray Place 2 sprays into both nostrils daily. 16 g 6   No current facility-administered medications on file prior to visit.     Review of Systems  Constitutional: Negative for activity change, appetite change, fatigue, fever and unexpected weight change.  HENT: Negative for congestion, rhinorrhea, sore throat and trouble swallowing.  Eyes: Negative for pain, redness, itching and visual disturbance.  Respiratory: Negative for cough, chest tightness, shortness of breath and wheezing.   Cardiovascular: Negative for chest pain and palpitations.  Gastrointestinal: Negative for abdominal pain, blood in stool, constipation, diarrhea and nausea.  Endocrine: Negative for cold intolerance, heat intolerance, polydipsia and polyuria.  Genitourinary: Negative for difficulty urinating, dysuria, frequency and urgency.  Musculoskeletal: Positive for back pain. Negative for arthralgias, joint swelling and myalgias.  Skin: Negative for pallor and rash.  Neurological: Positive for headaches.  Negative for dizziness, tremors, weakness and numbness.  Hematological: Negative for adenopathy. Does not bruise/bleed easily.  Psychiatric/Behavioral: Negative for decreased concentration and dysphoric mood. The patient is not nervous/anxious.        Objective:   Physical Exam Constitutional:      General: He is not in acute distress.    Appearance: Normal appearance. He is well-developed, normal weight and well-nourished. He is not ill-appearing or diaphoretic.  HENT:     Head: Normocephalic and atraumatic.     Mouth/Throat:     Mouth: Oropharynx is clear and moist.  Eyes:     Extraocular Movements: EOM normal.     Conjunctiva/sclera: Conjunctivae normal.     Pupils: Pupils are equal, round, and reactive to light.  Neck:     Thyroid: No thyromegaly.     Vascular: No carotid bruit or JVD.  Cardiovascular:     Rate and Rhythm: Normal rate and regular rhythm.     Pulses: Intact distal pulses.     Heart sounds: Normal heart sounds. No gallop.   Pulmonary:     Effort: Pulmonary effort is normal. No respiratory distress.     Breath sounds: Normal breath sounds. No wheezing or rales.     Comments: No crackles Abdominal:     General: Bowel sounds are normal. There is no distension or abdominal bruit.     Palpations: Abdomen is soft. There is no mass or pulsatile mass.     Tenderness: There is no abdominal tenderness.  Musculoskeletal:        General: No tenderness or edema.     Cervical back: Normal range of motion and neck supple.     Right lower leg: No edema.     Left lower leg: No edema.  Lymphadenopathy:     Cervical: No cervical adenopathy.  Skin:    General: Skin is warm and dry.     Findings: No rash.  Neurological:     Mental Status: He is alert.     Cranial Nerves: No cranial nerve deficit.     Motor: No weakness.     Coordination: Coordination normal.     Deep Tendon Reflexes: Reflexes are normal and symmetric. Reflexes normal.  Psychiatric:        Mood and  Affect: Mood and affect and mood normal.           Assessment & Plan:   Problem List Items Addressed This Visit      Cardiovascular and Mediastinum   Hypertension - Primary    Persistent elevated blood pressure with h/o chronic headaches  Disc HTN and why we treat it Disc lifestyle change in detail-handout given DASH eating and HTN Adv more water/less soda  Exercise-maximize if able Px metoprolol xl 50 mg daily (may also help chronic headaches)  Labs today (look for 2nd cause or end organ damage) F/u about 2 weeks        Relevant Medications   metoprolol succinate (TOPROL-XL)  50 MG 24 hr tablet   Other Relevant Orders   CBC with Differential/Platelet   Comprehensive metabolic panel   Lipid panel   TSH     Other   Chronic headache    For ? Years  Per pt unsure if sinus related or other  bp is up today also (? Chronic) Will tx with metoprolol xl 50 mg (inst to call if side effects)  F/u about 2 weeks  Enc more water intake and less soda/caffeine      Relevant Medications   metoprolol succinate (TOPROL-XL) 50 MG 24 hr tablet

## 2020-03-05 NOTE — Assessment & Plan Note (Signed)
For ? Years  Per pt unsure if sinus related or other  bp is up today also (? Chronic) Will tx with metoprolol xl 50 mg (inst to call if side effects)  F/u about 2 weeks  Enc more water intake and less soda/caffeine

## 2020-03-07 ENCOUNTER — Encounter: Payer: Self-pay | Admitting: Rehabilitative and Restorative Service Providers"

## 2020-03-12 ENCOUNTER — Encounter: Payer: BC Managed Care – PPO | Admitting: Rehabilitative and Restorative Service Providers"

## 2020-03-14 ENCOUNTER — Encounter: Payer: Self-pay | Admitting: Rehabilitative and Restorative Service Providers"

## 2020-03-18 ENCOUNTER — Other Ambulatory Visit: Payer: Self-pay

## 2020-03-19 ENCOUNTER — Encounter: Payer: Self-pay | Admitting: Family Medicine

## 2020-03-19 ENCOUNTER — Ambulatory Visit: Payer: BC Managed Care – PPO | Admitting: Family Medicine

## 2020-03-19 ENCOUNTER — Encounter: Payer: Self-pay | Admitting: Rehabilitative and Restorative Service Providers"

## 2020-03-19 VITALS — BP 125/80 | HR 79 | Temp 97.4°F | Ht 65.0 in | Wt 156.1 lb

## 2020-03-19 DIAGNOSIS — E785 Hyperlipidemia, unspecified: Secondary | ICD-10-CM | POA: Insufficient documentation

## 2020-03-19 DIAGNOSIS — E7800 Pure hypercholesterolemia, unspecified: Secondary | ICD-10-CM | POA: Diagnosis not present

## 2020-03-19 DIAGNOSIS — R7401 Elevation of levels of liver transaminase levels: Secondary | ICD-10-CM | POA: Diagnosis not present

## 2020-03-19 DIAGNOSIS — I10 Essential (primary) hypertension: Secondary | ICD-10-CM

## 2020-03-19 DIAGNOSIS — G8929 Other chronic pain: Secondary | ICD-10-CM

## 2020-03-19 DIAGNOSIS — R519 Headache, unspecified: Secondary | ICD-10-CM

## 2020-03-19 MED ORDER — METOPROLOL SUCCINATE ER 50 MG PO TB24
50.0000 mg | ORAL_TABLET | Freq: Every day | ORAL | 3 refills | Status: DC
Start: 2020-03-19 — End: 2021-04-10

## 2020-03-19 NOTE — Assessment & Plan Note (Signed)
Some improvement with beta blocker bp is also better controlled Working on more fluid intake

## 2020-03-19 NOTE — Assessment & Plan Note (Signed)
With otherwise nl liver labs Mild at 76  No symptoms Suspect this was from previous acetaminophen use Re check at next labs

## 2020-03-19 NOTE — Assessment & Plan Note (Signed)
Much improved bp in fair control at this time  BP Readings from Last 1 Encounters:  03/19/20 125/80   No changes needed Most recent labs reviewed  Disc lifstyle change with low sodium diet and exercise   Plan to continue metoprolol xl 50 mg daily

## 2020-03-19 NOTE — Assessment & Plan Note (Signed)
Disc goals for lipids and reasons to control them Rev last labs with pt Rev low sat fat diet in detail Plan to change diet and re check in 2-3 mo  Handouts given

## 2020-03-19 NOTE — Progress Notes (Signed)
Subjective:    Patient ID: Seth Bates, male    DOB: Jun 05, 1980, 40 y.o.   MRN: [id number]  This visit occurred during the SARS-CoV-2 public health emergency.  Safety protocols were in place, including screening questions prior to the visit, additional usage of staff PPE, and extensive cleaning of exam room while observing appropriate contact time as indicated for disinfecting solutions.    HPI Pt presents for f/u of HTN   Wt Readings from Last 3 Encounters:  03/19/20 156 lb 2 oz (70.8 kg)  03/05/20 156 lb 9 oz (71 kg)  03/04/20 150 lb (68 kg)   25.98 kg/m  Diagnosed with HTN last visit in the setting of chronic headaches Started on metoprolol xl 50 mg daily Also disc lifestyle change and DASH eating plan  BP Readings from Last 3 Encounters:  03/19/20 140/80  03/05/20 (!) 150/92  03/04/20 (!) 152/110   Pulse Readings from Last 3 Encounters:  03/19/20 79  03/05/20 82  03/04/20 74   Tolerating the medicine  010-932 systolic at home   Headaches are better than they were  Has had 3-4 headaches (at least 2 of them were because of caffeine withdrawal)  advil helped   Labs were done  Lab Results  Component Value Date   CREATININE 1.05 03/05/2020   BUN 11 03/05/2020   NA 138 03/05/2020   K 4.3 03/05/2020   CL 100 03/05/2020   CO2 32 03/05/2020   Lab Results  Component Value Date   WBC 8.8 03/05/2020   HGB 17.2 (H) 03/05/2020   HCT 50.6 03/05/2020   MCV 88.5 03/05/2020   PLT 373.0 03/05/2020    Lab Results  Component Value Date   ALT 76 (H) 03/05/2020   AST 33 03/05/2020   ALKPHOS 83 03/05/2020   BILITOT 0.8 03/05/2020   ALT is up  Was taking tylenol products at the time  Alka selzer cold and cough med and advil also   Off those medicine  Very rarely drinks alcohol   Diet is not terrible and not great  Drinking more water  Fried foods-not often   Cholesterol  Lab Results  Component Value Date   CHOL 250 (H) 03/05/2020   HDL 33.50 (L)  03/05/2020   LDLDIRECT 143.0 03/05/2020   TRIG 352.0 (H) 03/05/2020   CHOLHDL 7 03/05/2020   ? If it runs in family  Exercise -not a lot since august (was mountain biking)  Too much fatty dairy products   Patient Active Problem List   Diagnosis Date Noted  . Hyperlipidemia 03/19/2020  . Elevated ALT measurement 03/19/2020  . Hypertension 03/05/2020  . Chronic headache 03/05/2020  . Chronic bilateral low back pain with bilateral sciatica 03/04/2020  . Allergic rhinitis   . SHOULDER JOINT INSTABILITY 08/29/2008   Past Medical History:  Diagnosis Date  . Allergic rhinitis    No past surgical history on file. Social History   Tobacco Use  . Smoking status: Former Smoker    Packs/day: 0.25    Types: Cigarettes    Quit date: 11/01/2016    Years since quitting: 3.3  . Smokeless tobacco: Never Used  Vaping Use  . Vaping Use: Never used  Substance Use Topics  . Alcohol use: No    Alcohol/week: 0.0 standard drinks  . Drug use: No   No family history on file. Allergies  Allergen Reactions  . Robinul [Glycopyrrolate]     Dizzy, abdominal distention   Current Outpatient Medications on File  Prior to Visit  Medication Sig Dispense Refill  . cyclobenzaprine (FLEXERIL) 10 MG tablet Take 1 tablet (10 mg total) by mouth at bedtime as needed for muscle spasms. (Patient not taking: Reported on 03/19/2020) 30 tablet 1  . diclofenac (VOLTAREN) 75 MG EC tablet Take 1 tablet (75 mg total) by mouth 2 (two) times daily. (Patient not taking: Reported on 03/19/2020) 30 tablet 1  . dicyclomine (BENTYL) 10 MG capsule Take 1 capsule (10 mg total) by mouth 3 (three) times daily as needed for spasms. (Patient not taking: Reported on 03/19/2020) 90 capsule 11  . fluticasone (FLONASE) 50 MCG/ACT nasal spray Place 2 sprays into both nostrils daily. (Patient not taking: Reported on 03/19/2020) 16 g 6   No current facility-administered medications on file prior to visit.    Review of Systems   Constitutional: Negative for activity change, appetite change, fatigue, fever and unexpected weight change.  HENT: Negative for congestion, rhinorrhea, sore throat and trouble swallowing.   Eyes: Negative for pain, redness, itching and visual disturbance.  Respiratory: Negative for cough, chest tightness, shortness of breath and wheezing.   Cardiovascular: Negative for chest pain and palpitations.  Gastrointestinal: Negative for abdominal pain, blood in stool, constipation, diarrhea and nausea.  Endocrine: Negative for cold intolerance, heat intolerance, polydipsia and polyuria.  Genitourinary: Negative for difficulty urinating, dysuria, frequency and urgency.  Musculoskeletal: Negative for arthralgias, joint swelling and myalgias.  Skin: Negative for pallor and rash.  Neurological: Positive for headaches. Negative for dizziness, tremors, weakness, light-headedness and numbness.  Hematological: Negative for adenopathy. Does not bruise/bleed easily.  Psychiatric/Behavioral: Negative for decreased concentration and dysphoric mood. The patient is not nervous/anxious.        Objective:   Physical Exam Constitutional:      General: He is not in acute distress.    Appearance: Normal appearance. He is well-developed, normal weight and well-nourished. He is not ill-appearing.  HENT:     Head: Normocephalic and atraumatic.     Mouth/Throat:     Mouth: Oropharynx is clear and moist.  Eyes:     Extraocular Movements: EOM normal.     Conjunctiva/sclera: Conjunctivae normal.     Pupils: Pupils are equal, round, and reactive to light.  Neck:     Thyroid: No thyromegaly.     Vascular: No carotid bruit or JVD.  Cardiovascular:     Rate and Rhythm: Normal rate and regular rhythm.     Pulses: Intact distal pulses.     Heart sounds: Normal heart sounds. No gallop.   Pulmonary:     Effort: Pulmonary effort is normal. No respiratory distress.     Breath sounds: Normal breath sounds. No wheezing or  rales.     Comments: No crackles Abdominal:     General: Bowel sounds are normal. There is no distension or abdominal bruit.     Palpations: Abdomen is soft. There is no hepatomegaly, splenomegaly, mass or pulsatile mass.     Tenderness: There is no abdominal tenderness.  Musculoskeletal:        General: No edema.     Cervical back: Normal range of motion and neck supple.     Right lower leg: No edema.     Left lower leg: No edema.  Lymphadenopathy:     Cervical: No cervical adenopathy.  Skin:    General: Skin is warm and dry.     Coloration: Skin is not pale.     Findings: No erythema or rash.  Neurological:  Mental Status: He is alert.     Cranial Nerves: No cranial nerve deficit.     Coordination: Coordination normal.     Deep Tendon Reflexes: Reflexes are normal and symmetric. Reflexes normal.  Psychiatric:        Mood and Affect: Mood and affect and mood normal.           Assessment & Plan:   Problem List Items Addressed This Visit      Cardiovascular and Mediastinum   Hypertension - Primary    Much improved bp in fair control at this time  BP Readings from Last 1 Encounters:  03/19/20 125/80   No changes needed Most recent labs reviewed  Disc lifstyle change with low sodium diet and exercise   Plan to continue metoprolol xl 50 mg daily      Relevant Medications   metoprolol succinate (TOPROL-XL) 50 MG 24 hr tablet     Other   Chronic headache    Some improvement with beta blocker bp is also better controlled Working on more fluid intake      Relevant Medications   metoprolol succinate (TOPROL-XL) 50 MG 24 hr tablet   Hyperlipidemia    Disc goals for lipids and reasons to control them Rev last labs with pt Rev low sat fat diet in detail Plan to change diet and re check in 2-3 mo  Handouts given      Relevant Medications   metoprolol succinate (TOPROL-XL) 50 MG 24 hr tablet   Other Relevant Orders   Lipid panel   Hepatic function panel    Elevated ALT measurement    With otherwise nl liver labs Mild at 76  No symptoms Suspect this was from previous acetaminophen use Re check at next labs       Relevant Orders   Hepatic function panel

## 2020-03-19 NOTE — Patient Instructions (Addendum)
Avoid red meat/ fried foods/ egg yolks/ fatty breakfast meats/ butter, cheese and high fat dairy/ and shellfish    Start stepping away from greasy things  Substitute ground [place] for recipes   Deer meat is better also   Continue the metoprolol   Schedule labs for 2-3 months (fasting)

## 2020-03-21 ENCOUNTER — Encounter: Payer: Self-pay | Admitting: Rehabilitative and Restorative Service Providers"

## 2020-03-22 ENCOUNTER — Ambulatory Visit
Admission: RE | Admit: 2020-03-22 | Discharge: 2020-03-22 | Disposition: A | Discharge status: Home / Self Care | Payer: BC Managed Care – PPO | Source: Ambulatory Visit | Attending: Surgery | Admitting: Surgery

## 2020-03-22 ENCOUNTER — Other Ambulatory Visit: Payer: Self-pay

## 2020-03-22 DIAGNOSIS — M5416 Radiculopathy, lumbar region: Secondary | ICD-10-CM

## 2020-03-25 ENCOUNTER — Encounter: Payer: Self-pay | Admitting: Orthopaedic Surgery

## 2020-03-25 ENCOUNTER — Ambulatory Visit: Payer: BC Managed Care – PPO | Admitting: Orthopaedic Surgery

## 2020-03-25 VITALS — BP 135/79 | HR 72 | Ht 65.0 in | Wt 156.0 lb

## 2020-03-25 DIAGNOSIS — G8929 Other chronic pain: Secondary | ICD-10-CM | POA: Diagnosis not present

## 2020-03-25 DIAGNOSIS — M5441 Lumbago with sciatica, right side: Secondary | ICD-10-CM | POA: Diagnosis not present

## 2020-03-25 DIAGNOSIS — M5442 Lumbago with sciatica, left side: Secondary | ICD-10-CM | POA: Diagnosis not present

## 2020-03-25 DIAGNOSIS — M5136 Other intervertebral disc degeneration, lumbar region: Secondary | ICD-10-CM

## 2020-03-25 NOTE — Progress Notes (Signed)
Office Visit Note   Patient: Seth Bates           Date of Birth: [birth year]           MRN: [id number] Visit Date: 03/25/2020              Requested by: Tower, Wynelle Fanny, MD [place],  [place] [id number] PCP: Abner Greenspan, MD   Assessment & Plan: Visit Diagnoses:  1. Chronic bilateral low back pain with bilateral sciatica   2. Other intervertebral disc degeneration, lumbar region     Plan: We discussed single level disc degeneration with disc protrusion with mild to moderate lateral recess stenosis.  Symptoms currently are not severe enough to consider surgical intervention.  MRI scan is reviewed again copy of the report.  We discussed possible operative mention of his symptoms progress with increased radicular symptoms.ESI discussed as well.   Follow-Up Instructions: Return if symptoms worsen or fail to improve.   Orders:  No orders of the defined types were placed in this encounter.  No orders of the defined types were placed in this encounter.     Procedures: No procedures performed   Clinical Data: No additional findings.   Subjective: Chief Complaint  Patient presents with  . Lower Back - Pain, Follow-up    MRI lumbar review    HPI 40 year old male returns with ongoing problems with chronic back pain x10+ years intermittent symptoms.  Previous treatment with diclofenac, Flexeril, heat, therapy exercise program.  Patient does smoke.  Previous problems with shoulder instability.  Review of Systems 14 point update unchanged from 01/29/2020 office visit.   Objective: Vital Signs: BP 135/79   Pulse 72   Ht 5\' 5"  (1.651 m)   Wt 156 lb (70.8 kg)   BMI 25.96 kg/m   Physical Exam Constitutional:      Appearance: He is well-developed and well-nourished.  HENT:     Head: Normocephalic and atraumatic.  Eyes:     Extraocular Movements: EOM normal.     Pupils: Pupils are equal, round, and reactive to light.  Neck:     Thyroid: No  thyromegaly.     Trachea: No tracheal deviation.  Cardiovascular:     Rate and Rhythm: Normal rate.  Pulmonary:     Effort: Pulmonary effort is normal.     Breath sounds: No wheezing.  Abdominal:     General: Bowel sounds are normal.     Palpations: Abdomen is soft.  Skin:    General: Skin is warm and dry.     Capillary Refill: Capillary refill takes less than 2 seconds.  Neurological:     Mental Status: He is alert and oriented to person, place, and time.  Psychiatric:        Mood and Affect: Mood and affect normal.        Behavior: Behavior normal.        Thought Content: Thought content normal.        Judgment: Judgment normal.     Ortho Exam patient has some pain with straight leg raising on the left minimal sciatic notch tenderness.  He is able to heel and toe walk anterior tib gastrocsoleus is intact pedal pulses are 2+ and symmetrical good quad strength negative logroll hips.  Specialty Comments:  No specialty comments available.  Imaging: CLINICAL DATA:  Chronic low back pain radiating to the right buttocks and legs.  EXAM: MRI LUMBAR SPINE WITHOUT CONTRAST  TECHNIQUE: Multiplanar,  multisequence MR imaging of the lumbar spine was performed. No intravenous contrast was administered.  COMPARISON:  Lumbar spine radiographs 01/29/2020  FINDINGS: Segmentation: Transitional lumbosacral anatomy with partial sacralization of L5 on the left.  Alignment:  Normal.  Vertebrae: No fracture, suspicious osseous lesion, or significant marrow edema.  Conus medullaris and cauda equina: Conus extends to the T12-L1 level. Conus and cauda equina appear normal.  Paraspinal and other soft tissues: Unremarkable.  Disc levels:  L1-2 through L3-4: Negative.  L4-5: Disc desiccation and mild disc space narrowing. Disc bulging and a small left paracentral to subarticular disc protrusion result in mild-to-moderate left lateral recess stenosis and minimal bilateral  neural foraminal narrowing without spinal stenosis.  L5-S1: Negative.  IMPRESSION: Single level disc degeneration at L4-5 with a small disc protrusion resulting in mild-to-moderate left lateral recess stenosis. No right-sided neural impingement identified.   Electronically Signed   By: Logan Bores M.D.   On: 03/23/2020 19:39   PMFS History: Patient Active Problem List   Diagnosis Date Noted  . Other intervertebral disc degeneration, lumbar region 03/25/2020  . Hyperlipidemia 03/19/2020  . Elevated ALT measurement 03/19/2020  . Hypertension 03/05/2020  . Chronic headache 03/05/2020  . Chronic bilateral low back pain with bilateral sciatica 03/04/2020  . Allergic rhinitis   . SHOULDER JOINT INSTABILITY 08/29/2008   Past Medical History:  Diagnosis Date  . Allergic rhinitis     No family history on file.  No past surgical history on file. Social History   Occupational History  . Occupation: Teacher, adult education  Tobacco Use  . Smoking status: Former Smoker    Packs/day: 0.25    Types: Cigarettes    Quit date: 11/01/2016    Years since quitting: 3.3  . Smokeless tobacco: Never Used  Vaping Use  . Vaping Use: Never used  Substance and Sexual Activity  . Alcohol use: No    Alcohol/week: 0.0 standard drinks  . Drug use: No  . Sexual activity: Not on file

## 2020-03-26 ENCOUNTER — Ambulatory Visit: Payer: BC Managed Care – PPO | Admitting: Rehabilitative and Restorative Service Providers"

## 2020-03-26 ENCOUNTER — Encounter: Payer: Self-pay | Admitting: Rehabilitative and Restorative Service Providers"

## 2020-03-26 ENCOUNTER — Other Ambulatory Visit: Payer: Self-pay

## 2020-03-26 DIAGNOSIS — M6281 Muscle weakness (generalized): Secondary | ICD-10-CM

## 2020-03-26 DIAGNOSIS — G8929 Other chronic pain: Secondary | ICD-10-CM

## 2020-03-26 DIAGNOSIS — M5441 Lumbago with sciatica, right side: Secondary | ICD-10-CM | POA: Diagnosis not present

## 2020-03-26 DIAGNOSIS — M79604 Pain in right leg: Secondary | ICD-10-CM | POA: Diagnosis not present

## 2020-03-26 NOTE — Therapy (Signed)
[facility] [place], [place], [address] Phone: [phone]   Fax:  [phone]  Physical Therapy Treatment  Patient Details  Name: Seth Bates MRN: [id number] Date of Birth: 12-Dec-1980 Referring Provider (PT): Dr. Lorin Mercy   Encounter Date: 03/26/2020   PT End of Session - 03/26/20 1610    Visit Number 3    Number of Visits 12    Date for PT Re-Evaluation 04/16/20    Progress Note Due on Visit 10    PT Start Time 1558    PT Stop Time 1638    PT Time Calculation (min) 40 min    Activity Tolerance Patient tolerated treatment well    Behavior During Therapy [facility] for tasks assessed/performed           Past Medical History:  Diagnosis Date  . Allergic rhinitis     History reviewed. No pertinent surgical history.  There were no vitals filed for this visit.   Subjective Assessment - 03/26/20 1557    Subjective Pt. indicted feeling some sharp pains last week maybe.  Doing pretty well today.  No pain today.  Pt. stated he did some of the stretching exercises prior to feeling sharp pains.  Pt. stated symptoms are variable overall.  MRI was performed (see chart)    Patient Stated Goals Reduce pain    Currently in Pain? No/denies              [facility] PT Assessment - 03/26/20 0001      Assessment   Medical Diagnosis Low back pain bilateral sciatica    Referring Provider (PT) Dr. Lorin Mercy    Onset Date/Surgical Date 01/16/20    Hand Dominance Right      AROM   Lumbar Flexion to toes no complaints    Lumbar Extension 100 % no pain    Lumbar - Right Side Bend knee jt, center discomfort    Lumbar - Left Side Bend knee jt, center discomfort                         [facility] Adult PT Treatment/Exercise - 03/26/20 0001      Lumbar Exercises: Stretches   Other Lumbar Stretch Exercise sidelying regional rotation stretch 15 sec x 3 bilateral, prone press up x 10      Lumbar Exercises: Aerobic   Recumbent Bike  Lvl 5 10 mins      Lumbar Exercises: Prone   Other Prone Lumbar Exercises plank on knees 50 seconds    Other Prone Lumbar Exercises qruped arm/leg lift birddog 3 sec hold x 10 each side                  PT Education - 03/26/20 1613    Education Details Review of HEP c additional time spent.  Discussion on updated POC/MRI reportings    Person(s) Educated Patient    Methods Explanation;Demonstration    Comprehension Verbalized understanding;Returned demonstration            PT Short Term Goals - 03/26/20 1612      PT SHORT TERM GOAL #1   Title Patient will demonstrate independent use of home exercise program to maintain progress from in clinic treatments.    Time 3    Period Weeks    Status Achieved    Target Date 03/05/20             PT Long Term Goals - 03/26/20 1612  PT LONG TERM GOAL #1   Title Patient will demonstrate/report pain at worst less than or equal to 2/10 to facilitate minimal limitation in daily activity secondary to pain symptoms.    Time 8    Period Weeks    Status On-going    Target Date 04/09/20      PT LONG TERM GOAL #2   Title Patient will demonstrate independent use of home exercise program to facilitate ability to maintain/progress functional gains from skilled physical therapy services.    Time 8    Period Weeks    Status On-going    Target Date 04/09/20      PT LONG TERM GOAL #3   Title Patient will demonstrate return to work/recreational activity at previous level of function without limitations secondary due to condition with FOTO outcome > or = 66%    Time 8    Period Weeks    Status On-going    Target Date 04/09/20      PT LONG TERM GOAL #4   Title Patient will demonstrate lumbar extension 100 % WFL s symptoms to facilitate upright standing, walking posture at PLOF s limitation.    Time 8    Period Weeks    Status Achieved      PT LONG TERM GOAL #5   Title Pt. will demonstrate bilateral hip MMT 5/5 throughout to  facilitate usual recreational and work activity at [employer].    Time 8    Period Weeks    Status On-going    Target Date 04/09/20                 Plan - 03/26/20 1609    Clinical Impression Statement Overall review of presentation indicated improvement in mobility and overall reduction of symptoms c movement at this time.  Reviewed HEP c addition of core strengthening.    Examination-Activity Limitations Bend;Sleep;Squat;Lift;Reach Overhead    Examination-Participation Restrictions Other;Occupation;Community Activity;Yard Work   exercise   Stability/Clinical Decision Making Stable/Uncomplicated    Rehab Potential Good    PT Frequency --   1-2x/week   PT Duration 8 weeks    PT Treatment/Interventions ADLs/Self Care Home Management;Electrical Stimulation;Iontophoresis 4mg /ml Dexamethasone;Moist Heat;Traction;Balance training;Therapeutic exercise;Therapeutic activities;Functional mobility training;Stair training;Gait training;DME Instruction;Ultrasound;Neuromuscular re-education;Patient/family education;Manual techniques;Taping;Dry needling;Passive range of motion;Spinal Manipulations;Joint Manipulations    PT Next Visit Plan Manual for improved mobility/pain relief.  Progressive strengthening hip/core    PT Home Exercise Plan [id number]    Consulted and Agree with Plan of Care Patient           Patient will benefit from skilled therapeutic intervention in order to improve the following deficits and impairments:  Decreased endurance,Hypomobility,Pain,Decreased strength,Decreased activity tolerance,Decreased mobility,Improper body mechanics,Impaired perceived functional ability,Impaired flexibility,Decreased range of motion  Visit Diagnosis: Chronic bilateral low back pain with right-sided sciatica  Muscle weakness (generalized)  Pain in right leg     Problem List Patient Active Problem List   Diagnosis Date Noted  . Other intervertebral disc degeneration, lumbar region  03/25/2020  . Hyperlipidemia 03/19/2020  . Elevated ALT measurement 03/19/2020  . Hypertension 03/05/2020  . Chronic headache 03/05/2020  . Chronic bilateral low back pain with bilateral sciatica 03/04/2020  . Allergic rhinitis   . SHOULDER JOINT INSTABILITY 08/29/2008    Scot Jun, PT, DPT, OCS, ATC 03/26/20  4:17 PM     [facility] [address] [place], [place], [address] Phone: [phone]   Fax:  [phone]  Name: Seth Bates MRN: [id number] Date of  Birth: 05/20/1980

## 2020-03-28 ENCOUNTER — Other Ambulatory Visit: Payer: Self-pay

## 2020-03-28 ENCOUNTER — Ambulatory Visit: Payer: BC Managed Care – PPO | Admitting: Rehabilitative and Restorative Service Providers"

## 2020-03-28 ENCOUNTER — Encounter: Payer: Self-pay | Admitting: Rehabilitative and Restorative Service Providers"

## 2020-03-28 DIAGNOSIS — G8929 Other chronic pain: Secondary | ICD-10-CM | POA: Diagnosis not present

## 2020-03-28 DIAGNOSIS — M5441 Lumbago with sciatica, right side: Secondary | ICD-10-CM

## 2020-03-28 DIAGNOSIS — M79604 Pain in right leg: Secondary | ICD-10-CM | POA: Diagnosis not present

## 2020-03-28 DIAGNOSIS — M6281 Muscle weakness (generalized): Secondary | ICD-10-CM

## 2020-03-28 NOTE — Therapy (Addendum)
[place], [place], [address] Phone: [phone]   Fax:  [phone]  Physical Therapy Treatment/Discharge  Patient Details  Name: Seth Bates MRN: [id number] Date of Birth: 1981/02/01 Referring Provider (PT): Dr. Lorin Mercy   Encounter Date: 03/28/2020      PT End of Session - 03/28/20 1355    Visit Number 4    Number of Visits 12    Date for PT Re-Evaluation 04/16/20    Progress Note Due on Visit 10    PT Start Time 6144    PT Stop Time 1423    PT Time Calculation (min) 38 min    Activity Tolerance Patient tolerated treatment well    Behavior During Therapy [facility] for tasks assessed/performed           Past Medical History:  Diagnosis Date  . Allergic rhinitis     History reviewed. No pertinent surgical history.  There were no vitals filed for this visit.   Subjective Assessment - 03/28/20 1357    Subjective Pt. indicated feeling no pain today, feeling pretty good since last visit.   Pt. stated overall improvement to normal at 100% at this time.    Patient Stated Goals Reduce pain    Currently in Pain? No/denies    Pain Score 0-No pain              [facility] PT Assessment - 03/28/20 0001      Assessment   Medical Diagnosis Low back pain bilateral sciatica    Referring Provider (PT) Dr. Lorin Mercy    Onset Date/Surgical Date 01/16/20    Hand Dominance Right      Observation/Other Assessments   Focus on Therapeutic Outcomes (FOTO)  94% update      AROM   Lumbar Flexion to toes no complaints    Lumbar Extension 100 % no pain    Lumbar - Right Side Bend knee jt, center tightness    Lumbar - Left Side Bend knee jt, center tightness                         [facility] Adult PT Treatment/Exercise - 03/28/20 0001      Exercises   Other Exercises  HEP review c cues, question/answer )      Lumbar Exercises: Aerobic   Recumbent Bike Lvl 5 12 mins      Lumbar Exercises: Supine   Other  Supine Lumbar Exercises supine dying bug 2 x 10 bilateral      Lumbar Exercises: Sidelying   Other Sidelying Lumbar Exercises sideplank on knees to fatigue (20 secs x3 ) bilateral                  PT Education - 03/28/20 1421    Education Details HEP review    Person(s) Educated Patient    Methods Explanation;Demonstration;Handout;Verbal cues    Comprehension Verbalized understanding;Returned demonstration            PT Short Term Goals - 03/26/20 1612      PT SHORT TERM GOAL #1   Title Patient will demonstrate independent use of home exercise program to maintain progress from in clinic treatments.    Time 3    Period Weeks    Status Achieved    Target Date 03/05/20             PT Long Term Goals - 03/26/20 1612      PT LONG TERM GOAL #  1   Title Patient will demonstrate/report pain at worst less than or equal to 2/10 to facilitate minimal limitation in daily activity secondary to pain symptoms.    Time 8    Period Weeks    Status On-going    Target Date 04/09/20      PT LONG TERM GOAL #2   Title Patient will demonstrate independent use of home exercise program to facilitate ability to maintain/progress functional gains from skilled physical therapy services.    Time 8    Period Weeks    Status On-going    Target Date 04/09/20      PT LONG TERM GOAL #3   Title Patient will demonstrate return to work/recreational activity at previous level of function without limitations secondary due to condition with FOTO outcome > or = 66%    Time 8    Period Weeks    Status On-going    Target Date 04/09/20      PT LONG TERM GOAL #4   Title Patient will demonstrate lumbar extension 100 % WFL s symptoms to facilitate upright standing, walking posture at PLOF s limitation.    Time 8    Period Weeks    Status Achieved      PT LONG TERM GOAL #5   Title Pt. will demonstrate bilateral hip MMT 5/5 throughout to facilitate usual recreational and work activity at [employer].     Time 8    Period Weeks    Status On-going    Target Date 04/09/20                 Plan - 03/28/20 1420    Clinical Impression Statement Pt. indicated good improvement overall, FOTO score greatly improved compared to eval showing improved function.  Current presentation of knowledge of HEP and minimal symptoms suggest trial HEP period with return prn based off symptom presentation.    Examination-Activity Limitations Bend;Sleep;Squat;Lift;Reach Overhead    Examination-Participation Restrictions Other;Occupation;Community Activity;Yard Work   exercise   Stability/Clinical Decision Making Stable/Uncomplicated    Rehab Potential Good    PT Frequency --   1-2x/week   PT Duration 8 weeks    PT Treatment/Interventions ADLs/Self Care Home Management;Electrical Stimulation;Iontophoresis 21m/ml Dexamethasone;Moist Heat;Traction;Balance training;Therapeutic exercise;Therapeutic activities;Functional mobility training;Stair training;Gait training;DME Instruction;Ultrasound;Neuromuscular re-education;Patient/family education;Manual techniques;Taping;Dry needling;Passive range of motion;Spinal Manipulations;Joint Manipulations    PT Next Visit Plan Hold PT, return prn    PT Home Exercise Plan [id number]    Consulted and Agree with Plan of Care Patient           Patient will benefit from skilled therapeutic intervention in order to improve the following deficits and impairments:  Decreased endurance,Hypomobility,Pain,Decreased strength,Decreased activity tolerance,Decreased mobility,Improper body mechanics,Impaired perceived functional ability,Impaired flexibility,Decreased range of motion  Visit Diagnosis: Chronic bilateral low back pain with right-sided sciatica  Muscle weakness (generalized)  Pain in right leg     Problem List Patient Active Problem List   Diagnosis Date Noted  . Other intervertebral disc degeneration, lumbar region 03/25/2020  . Hyperlipidemia 03/19/2020  .  Elevated ALT measurement 03/19/2020  . Hypertension 03/05/2020  . Chronic headache 03/05/2020  . Chronic bilateral low back pain with bilateral sciatica 03/04/2020  . Allergic rhinitis   . SHOULDER JOINT INSTABILITY 08/29/2008    MScot Jun PT, DPT, OCS, ATC 03/28/20  2:26 PM   PHYSICAL THERAPY DISCHARGE SUMMARY  Visits from Start of Care: 4  Current functional level related to goals / functional outcomes: See note   Remaining deficits: See  note   Education / Equipment: HEP Plan: Patient agrees to discharge.  Patient goals were partially met. Patient is being discharged due to being pleased with the current functional level.  ?????  Scot Jun, PT, DPT, OCS, ATC 05/12/20  10:46 AM         [facility] [address] [place], [place], [address] Phone: [phone]   Fax:  [phone]  Name: Seth Bates MRN: [id number] Date of Birth: 06-24-1980

## 2020-05-19 ENCOUNTER — Other Ambulatory Visit: Payer: Self-pay

## 2020-05-19 ENCOUNTER — Other Ambulatory Visit (INDEPENDENT_AMBULATORY_CARE_PROVIDER_SITE_OTHER): Payer: BC Managed Care – PPO

## 2020-05-19 DIAGNOSIS — R7401 Elevation of levels of liver transaminase levels: Secondary | ICD-10-CM | POA: Diagnosis not present

## 2020-05-19 DIAGNOSIS — E7800 Pure hypercholesterolemia, unspecified: Secondary | ICD-10-CM

## 2020-05-20 LAB — HEPATIC FUNCTION PANEL
ALT: 63 U/L — ABNORMAL HIGH (ref 0–53)
AST: 29 U/L (ref 0–37)
Albumin: 4.3 g/dL (ref 3.5–5.2)
Alkaline Phosphatase: 66 U/L (ref 39–117)
Bilirubin, Direct: 0.1 mg/dL (ref 0.0–0.3)
Total Bilirubin: 1 mg/dL (ref 0.2–1.2)
Total Protein: 6.7 g/dL (ref 6.0–8.3)

## 2020-05-20 LAB — LIPID PANEL
Cholesterol: 209 mg/dL — ABNORMAL HIGH (ref 0–200)
HDL: 38.2 mg/dL — ABNORMAL LOW (ref >39.00)
NonHDL: 170.31
Total CHOL/HDL Ratio: 5
Triglycerides: 217 mg/dL — ABNORMAL HIGH (ref 0.0–149.0)
VLDL: 43.4 mg/dL — ABNORMAL HIGH (ref 0.0–40.0)

## 2020-05-20 LAB — LDL CHOLESTEROL, DIRECT: Direct LDL: 139 mg/dL

## 2020-09-24 ENCOUNTER — Ambulatory Visit: Payer: BC Managed Care – PPO | Admitting: Family Medicine

## 2020-09-24 ENCOUNTER — Other Ambulatory Visit: Payer: Self-pay

## 2020-09-24 ENCOUNTER — Encounter: Payer: Self-pay | Admitting: Family Medicine

## 2020-09-24 VITALS — BP 124/82 | HR 62 | Temp 98.5°F | Ht 65.0 in | Wt 158.1 lb

## 2020-09-24 DIAGNOSIS — R42 Dizziness and giddiness: Secondary | ICD-10-CM

## 2020-09-24 DIAGNOSIS — J301 Allergic rhinitis due to pollen: Secondary | ICD-10-CM | POA: Diagnosis not present

## 2020-09-24 DIAGNOSIS — K219 Gastro-esophageal reflux disease without esophagitis: Secondary | ICD-10-CM | POA: Insufficient documentation

## 2020-09-24 MED ORDER — MECLIZINE HCL 25 MG PO TABS: 25.0000 mg | ORAL_TABLET | Freq: Three times a day (TID) | ORAL | 1 refills | Status: DC | PRN

## 2020-09-24 MED ORDER — OMEPRAZOLE 20 MG PO CPDR: 20.0000 mg | DELAYED_RELEASE_CAPSULE | Freq: Every day | ORAL | 3 refills | Status: DC

## 2020-09-24 MED ORDER — TRIAMCINOLONE ACETONIDE 55 MCG/ACT NA AERO: 2.0000 | INHALATION_SPRAY | Freq: Every day | NASAL | 10 refills | Status: DC

## 2020-09-24 NOTE — Assessment & Plan Note (Signed)
Px nasacort ns instead of flonase to see if he will tolerate better  May help prevent ETD and vertigo

## 2020-09-24 NOTE — Assessment & Plan Note (Signed)
Recurrent - improved today  ? If linked to ongoing sinus congestion (some ETD in R ear)  Handout given re: vertigo and epley maneuver  Meclizine px for use prn  nasacort ns through the fall Update if not starting to improve in a week or if worsening  Consider ENT attention if not improved

## 2020-09-24 NOTE — Assessment & Plan Note (Signed)
Handouts given re: diet and GERD Some imp on pepcid but still quite symptomatic Px omeprazole 20 mg daily in am -then update if not much improved in 1-2 week Then plan how long to stay on  Diet counseling

## 2020-09-24 NOTE — Progress Notes (Signed)
Subjective:    Patient ID: Seth Bates, male    DOB: 27-Oct-1980, 40 y.o.   MRN: [id number]  This visit occurred during the SARS-CoV-2 public health emergency.  Safety protocols were in place, including screening questions prior to the visit, additional usage of staff PPE, and extensive cleaning of exam room while observing appropriate contact time as indicated for disinfecting solutions.   HPI Pt presents for c/o GERD symptoms and dizziness   Wt Readings from Last 3 Encounters:  09/24/20 158 lb 2 oz (71.7 kg)  03/25/20 156 lb (70.8 kg)  03/19/20 156 lb 2 oz (70.8 kg)   26.31 kg/m  Acid reflux symptoms for several months Chest and throat- worse at night  Sometimes weird taste in mouth  More hoarse and clearing throat   Eating the same  No particular triggers Not stressed   Not a lot of spicy food  Caffeine- some soda  No coffee   Tried some pepcid-helps some but does not take it away   Off diclofenac since last labs and PE   Dizziness-gets vertigo 2-3 times per year  Started this weekend  Worse when lying down  Meclizine helps-needs a refill  L ear feels funny (some pain and pressure) Woke up Sunday am with spinning  No more congestion than usual   Flonase gave him a headache in  the past   Today does not feel dizzy  Got some motion sickness   [place] the beach 3 [place] ago No alt change or flying   Patient Active Problem List   Diagnosis Date Noted  . Acid reflux 09/24/2020  . Other intervertebral disc degeneration, lumbar region 03/25/2020  . Hyperlipidemia 03/19/2020  . Elevated ALT measurement 03/19/2020  . Hypertension 03/05/2020  . Chronic headache 03/05/2020  . Chronic bilateral low back pain with bilateral sciatica 03/04/2020  . Vertigo 02/07/2012  . Allergic rhinitis   . SHOULDER JOINT INSTABILITY 08/29/2008   Past Medical History:  Diagnosis Date  . Allergic rhinitis    No past surgical history on file. Social History   Tobacco Use   . Smoking status: Former    Packs/day: 0.25    Types: Cigarettes    Quit date: 11/01/2016    Years since quitting: 3.8  . Smokeless tobacco: Never  Vaping Use  . Vaping Use: Never used  Substance Use Topics  . Alcohol use: No    Alcohol/week: 0.0 standard drinks  . Drug use: No   No family history on file. Allergies  Allergen Reactions  . Robinul [Glycopyrrolate]     Dizzy, abdominal distention   Current Outpatient Medications on File Prior to Visit  Medication Sig Dispense Refill  . metoprolol succinate (TOPROL-XL) 50 MG 24 hr tablet Take 1 tablet (50 mg total) by mouth daily. Take with or immediately following a meal. 90 tablet 3  . cyclobenzaprine (FLEXERIL) 10 MG tablet Take 1 tablet (10 mg total) by mouth at bedtime as needed for muscle spasms. (Patient not taking: Reported on 09/24/2020) 30 tablet 1  . diclofenac (VOLTAREN) 75 MG EC tablet Take 1 tablet (75 mg total) by mouth 2 (two) times daily. (Patient not taking: Reported on 09/24/2020) 30 tablet 1  . dicyclomine (BENTYL) 10 MG capsule Take 1 capsule (10 mg total) by mouth 3 (three) times daily as needed for spasms. (Patient not taking: Reported on 09/24/2020) 90 capsule 11   No current facility-administered medications on file prior to visit.    Review of Systems  Constitutional:  Negative for activity change, appetite change, fatigue, fever and unexpected weight change.  HENT:  Negative for congestion, rhinorrhea, sore throat and trouble swallowing.   Eyes:  Negative for pain, redness, itching and visual disturbance.  Respiratory:  Negative for cough, chest tightness, shortness of breath and wheezing.   Cardiovascular:  Negative for chest pain and palpitations.  Gastrointestinal:  Negative for abdominal distention, abdominal pain, anal bleeding, blood in stool, constipation, diarrhea, nausea and vomiting.       Heartburn   Endocrine: Negative for cold intolerance, heat intolerance, polydipsia and polyuria.   Genitourinary:  Negative for difficulty urinating, dysuria, frequency and urgency.  Musculoskeletal:  Negative for arthralgias, joint swelling and myalgias.  Skin:  Negative for pallor and rash.  Neurological:  Positive for dizziness. Negative for tremors, syncope, facial asymmetry, weakness, light-headedness, numbness and headaches.  Hematological:  Negative for adenopathy. Does not bruise/bleed easily.  Psychiatric/Behavioral:  Negative for decreased concentration and dysphoric mood. The patient is not nervous/anxious.       Objective:   Physical Exam Constitutional:      General: He is not in acute distress.    Appearance: Normal appearance. He is well-developed. He is not ill-appearing or diaphoretic.  HENT:     Head: Normocephalic and atraumatic.     Right Ear: Ear canal and external ear normal.     Left Ear: Tympanic membrane, ear canal and external ear normal. There is no impacted cerumen.     Ears:     Comments: R TM is dull    Nose: Nose normal.     Mouth/Throat:     Pharynx: No oropharyngeal exudate.  Eyes:     General: No scleral icterus.       Right eye: No discharge.        Left eye: No discharge.     Conjunctiva/sclera: Conjunctivae normal.     Pupils: Pupils are equal, round, and reactive to light.     Comments: Few beats of horizontal nystagmus bilat  Neck:     Thyroid: No thyromegaly.     Vascular: No carotid bruit or JVD.     Trachea: No tracheal deviation.  Cardiovascular:     Rate and Rhythm: Normal rate and regular rhythm.     Heart sounds: Normal heart sounds. No murmur heard. Pulmonary:     Effort: Pulmonary effort is normal. No respiratory distress.     Breath sounds: Normal breath sounds. No wheezing or rales.  Abdominal:     General: Bowel sounds are normal. There is no distension.     Palpations: Abdomen is soft. There is no mass.     Tenderness: There is no abdominal tenderness. There is no guarding or rebound.     Hernia: No hernia is present.   Musculoskeletal:        General: No tenderness.     Cervical back: Full passive range of motion without pain, normal range of motion and neck supple.     Right lower leg: No edema.     Left lower leg: No edema.  Lymphadenopathy:     Cervical: No cervical adenopathy.  Skin:    General: Skin is warm and dry.     Coloration: Skin is not jaundiced or pale.     Findings: No rash.  Neurological:     Mental Status: He is alert and oriented to person, place, and time.     Cranial Nerves: No cranial nerve deficit, dysarthria or facial asymmetry.  Sensory: No sensory deficit.     Motor: No weakness, tremor, atrophy or abnormal muscle tone.     Coordination: Coordination is intact. Romberg sign negative. Coordination normal.     Gait: Gait is intact. Gait normal.     Deep Tendon Reflexes: Reflexes are normal and symmetric.     Comments: No focal cerebellar signs   Pt feels dizzy during rhomberg  Psychiatric:        Behavior: Behavior normal.        Thought Content: Thought content normal.          Assessment & Plan:   Problem List Items Addressed This Visit       Respiratory   Allergic rhinitis    Px nasacort ns instead of flonase to see if he will tolerate better  May help prevent ETD and vertigo        Digestive   Acid reflux - Primary    Handouts given re: diet and GERD Some imp on pepcid but still quite symptomatic Px omeprazole 20 mg daily in am -then update if not much improved in 1-2 week Then plan how long to stay on  Diet counseling        Relevant Medications   meclizine (ANTIVERT) 25 MG tablet   omeprazole (PRILOSEC) 20 MG capsule     Other   Vertigo    Recurrent - improved today  ? If linked to ongoing sinus congestion (some ETD in R ear)  Handout given re: vertigo and epley maneuver  Meclizine px for use prn  nasacort ns through the fall Update if not starting to improve in a week or if worsening  Consider ENT attention if not improved

## 2020-09-24 NOTE — Patient Instructions (Signed)
Try to avoid foods that worsen reflux  Don't eat right before bed    Take omeprazole 20 mg every am at least 30 minutes before breakfast  If reflux is not improving in 1-2 weeks please call and let us know   For vertigo  Look at the information on the epeley maneuver  Take meclizine if you need it  Stay hydrated  Use nasacort daily though the fall   Update if not starting to improve in a week or if worsening

## 2021-01-27 ENCOUNTER — Ambulatory Visit: Payer: BC Managed Care – PPO | Admitting: Orthopaedic Surgery

## 2021-01-27 ENCOUNTER — Encounter: Payer: Self-pay | Admitting: Orthopaedic Surgery

## 2021-01-27 ENCOUNTER — Other Ambulatory Visit: Payer: Self-pay

## 2021-01-27 VITALS — BP 144/72 | HR 74 | Ht 66.0 in | Wt 160.0 lb

## 2021-01-27 DIAGNOSIS — G8929 Other chronic pain: Secondary | ICD-10-CM

## 2021-01-27 DIAGNOSIS — M5441 Lumbago with sciatica, right side: Secondary | ICD-10-CM

## 2021-01-27 DIAGNOSIS — M5442 Lumbago with sciatica, left side: Secondary | ICD-10-CM

## 2021-01-27 DIAGNOSIS — M5126 Other intervertebral disc displacement, lumbar region: Secondary | ICD-10-CM

## 2021-01-27 MED ORDER — PREDNISONE 10 MG (21) PO TBPK: ORAL_TABLET | ORAL | 0 refills | Status: DC

## 2021-01-27 NOTE — Progress Notes (Signed)
Office Visit Note   Patient: Seth Bates           Date of Birth: May 17, 1980           MRN: [id number] Visit Date: 01/27/2021              Requested by: Tower, Wynelle Fanny, MD [place],  [place] [address] PCP: Abner Greenspan, MD   Assessment & Plan: Visit Diagnoses: 1.  Protrusion of lumbar intervertebral disc 2.  Chronic bilateral low back pain with bilateral sciatica   Plan: Reviewed his MRI images and report.  We will set him up for single epidural injection at L4-5 hopefully get some relief he states pain is significant.  Medrol Dosepak sent in.  I can follow-up after the epidural.  Follow-Up Instructions: No follow-ups on file.   Orders:  Orders Placed This Encounter  Procedures   Ambulatory referral to Physical Medicine Rehab   Meds ordered this encounter  Medications   predniSONE (STERAPRED UNI-PAK 21 TAB) 10 MG (21) TBPK tablet    Sig: TAKE 6,5,4,3,2,1 one tablet less each day with food.    Dispense:  21 tablet    Refill:  0      Procedures: No procedures performed   Clinical Data: No additional findings.   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  He states at times he has great difficulty has to be helped to get out of bed he is taken Aleve not sure it really helped.  Previously seen in February.  Previous MRI in February showed single level disc protrusion L4-5 with single level disc desiccation and moderate left lateral recess stenosis.  He is having more symptoms in his right buttocks than left.  No chills or fever no night pain.  No associated bowel or bladder symptoms.  Review of Systems all systems noncontributory to  HPI.   Objective: Vital Signs: BP (!) 144/72    Pulse 74    Ht 5\' 6"  (1.676 m)    Wt 160 lb (72.6 kg)    BMI 25.82 kg/m   Physical Exam Constitutional:      Appearance: He is well-developed.  HENT:     Head: Normocephalic and atraumatic.     Right Ear: External ear normal.     Left Ear: External ear normal.  Eyes:     Pupils: Pupils are equal, round, and reactive to light.  Neck:     Thyroid: No thyromegaly.     Trachea: No tracheal deviation.  Cardiovascular:     Rate and Rhythm: Normal rate.  Pulmonary:     Effort: Pulmonary effort is normal.     Breath sounds: No wheezing.  Abdominal:     General: Bowel sounds are normal.     Palpations: Abdomen is soft.  Musculoskeletal:     Cervical back: Neck supple.  Skin:    General: Skin is warm and dry.     Capillary Refill: Capillary refill takes less than 2 seconds.  Neurological:     Mental Status: He is alert and oriented to person, place, and time.  Psychiatric:        Behavior: Behavior normal.  Thought Content: Thought content normal.        Judgment: Judgment normal.    Ortho Exam patient has sciatic notch tenderness right and left tenderness to palpation at L4-5 level.  Negative logroll the hips knee and ankle jerk are intact anterior tib gastrocsoleus is intact he ambulates with slight flexion at the hips due to back pain.  Sensory lower extremities intact.  Specialty Comments:  No specialty comments available.  Imaging: Narrative & Impression  CLINICAL DATA:  Chronic low back pain radiating to the right buttocks and legs.   EXAM: MRI LUMBAR SPINE WITHOUT CONTRAST   TECHNIQUE: Multiplanar, multisequence MR imaging of the lumbar spine was performed. No intravenous contrast was administered.   COMPARISON:  Lumbar spine radiographs 01/29/2020   FINDINGS: Segmentation: Transitional lumbosacral anatomy with partial sacralization of L5 on the left.   Alignment:  Normal.   Vertebrae: No fracture,  suspicious osseous lesion, or significant marrow edema.   Conus medullaris and cauda equina: Conus extends to the T12-L1 level. Conus and cauda equina appear normal.   Paraspinal and other soft tissues: Unremarkable.   Disc levels:   L1-2 through L3-4: Negative.   L4-5: Disc desiccation and mild disc space narrowing. Disc bulging and a small left paracentral to subarticular disc protrusion result in mild-to-moderate left lateral recess stenosis and minimal bilateral neural foraminal narrowing without spinal stenosis.   L5-S1: Negative.   IMPRESSION: Single level disc degeneration at L4-5 with a small disc protrusion resulting in mild-to-moderate left lateral recess stenosis. No right-sided neural impingement identified.     Electronically Signed   By: Logan Bores M.D.   On: 03/23/2020 19:39     PMFS History: Patient Active Problem List   Diagnosis Date Noted   Protrusion of lumbar intervertebral disc 01/27/2021   Acid reflux 09/24/2020   Other intervertebral disc degeneration, lumbar region 03/25/2020   Hyperlipidemia 03/19/2020   Elevated ALT measurement 03/19/2020   Hypertension 03/05/2020   Chronic headache 03/05/2020   Chronic bilateral low back pain with bilateral sciatica 03/04/2020   Vertigo 02/07/2012   Allergic rhinitis    SHOULDER JOINT INSTABILITY 08/29/2008   Past Medical History:  Diagnosis Date   Allergic rhinitis     No family history on file.  No past surgical history on file. Social History   Occupational History   Occupation: Teacher, adult education  Tobacco Use   Smoking status: Former    Packs/day: 0.25    Types: Cigarettes    Quit date: 11/01/2016    Years since quitting: 4.2   Smokeless tobacco: Never  Vaping Use   Vaping Use: Never used  Substance and Sexual Activity   Alcohol use: No    Alcohol/week: 0.0 standard drinks   Drug use: No   Sexual activity: Not on file

## 2021-02-23 ENCOUNTER — Other Ambulatory Visit: Payer: Self-pay

## 2021-02-23 ENCOUNTER — Ambulatory Visit: Payer: Self-pay

## 2021-02-23 ENCOUNTER — Ambulatory Visit: Payer: BC Managed Care – PPO | Admitting: Physical Medicine and Rehabilitation

## 2021-02-23 ENCOUNTER — Encounter: Payer: Self-pay | Admitting: Physical Medicine and Rehabilitation

## 2021-02-23 VITALS — BP 119/77 | HR 68

## 2021-02-23 DIAGNOSIS — M5416 Radiculopathy, lumbar region: Secondary | ICD-10-CM

## 2021-02-23 MED ORDER — METHYLPREDNISOLONE ACETATE 80 MG/ML IJ SUSP
80.0000 mg | Freq: Once | INTRAMUSCULAR | Status: AC
  Administered 2021-02-23: 80 mg

## 2021-02-23 NOTE — Procedures (Signed)
Lumbar Epidural Steroid Injection - Interlaminar Approach with Fluoroscopic Guidance  Patient: Seth Bates      Date of Birth: 1980-02-22 MRN: [id number] PCP: Abner Greenspan, MD      Visit Date: 02/23/2021   Universal Protocol:     Consent Given By: the patient  Position: PRONE  Additional Comments: Vital signs were monitored before and after the procedure. Patient was prepped and draped in the usual sterile fashion. The correct patient, procedure, and site was verified.   Injection Procedure Details:   Procedure diagnoses: Lumbar radiculopathy [M54.16]   Meds Administered:  Meds ordered this encounter  Medications   methylPREDNISolone acetate (DEPO-MEDROL) injection 80 mg     Laterality: Right  Location/Site:  L5-S1  Needle: 3.5 in., 20 ga. Tuohy  Needle Placement: Paramedian epidural  Findings:   -Comments: Excellent flow of contrast into the epidural space.  Procedure Details: Using a paramedian approach from the side mentioned above, the region overlying the inferior lamina was localized under fluoroscopic visualization and the soft tissues overlying this structure were infiltrated with 4 ml. of 1% Lidocaine without Epinephrine. The Tuohy needle was inserted into the epidural space using a paramedian approach.   The epidural space was localized using loss of resistance along with counter oblique bi-planar fluoroscopic views.  After negative aspirate for air, blood, and CSF, a 2 ml. volume of Isovue-250 was injected into the epidural space and the flow of contrast was observed. Radiographs were obtained for documentation purposes.    The injectate was administered into the level noted above.   Additional Comments:  The patient tolerated the procedure well Dressing: 2 x 2 sterile gauze and Band-Aid    Post-procedure details: Patient was observed during the procedure. Post-procedure instructions were reviewed.  Patient left the clinic in stable  condition.

## 2021-02-23 NOTE — Progress Notes (Signed)
DEMOND SHALLENBERGER - 41 y.o. male MRN [id number]  Date of birth: [birth year]  Office Visit Note: Visit Date: 02/23/2021 PCP: Abner Greenspan, MD Referred by: Tower, Wynelle Fanny, MD  Subjective: Chief Complaint  Patient presents with   Lower Back - Pain   Right Leg - Pain   HPI:  Seth Bates is a 41 y.o. male who comes in today at the request of Dr. Rodell Perna for planned Right L5-S1 Lumbar Interlaminar epidural steroid injection with fluoroscopic guidance.  The patient has failed conservative care including home exercise, medications, time and activity modification.  This injection will be diagnostic and hopefully therapeutic.  Please see requesting physician notes for further details and justification. MRI reviewed with images and spine model.  MRI reviewed in the note below. He is very anxious and scared of needles. He reports history of vasovagal with blood draws.  ROS Otherwise per HPI.  Assessment & Plan: Visit Diagnoses:    ICD-10-CM   1. Lumbar radiculopathy  M54.16 XR C-ARM NO REPORT    Epidural Steroid injection    methylPREDNISolone acetate (DEPO-MEDROL) injection 80 mg      Plan: No additional findings.   Meds & Orders:  Meds ordered this encounter  Medications   methylPREDNISolone acetate (DEPO-MEDROL) injection 80 mg    Orders Placed This Encounter  Procedures   XR C-ARM NO REPORT   Epidural Steroid injection    Follow-up: Return in about 2 weeks (around 03/09/2021) for Rodell Perna, MD.   Procedures: No procedures performed  Lumbar Epidural Steroid Injection - Interlaminar Approach with Fluoroscopic Guidance  Patient: Seth Bates      Date of Birth: Aug 11, 1980 MRN: [id number] PCP: Abner Greenspan, MD      Visit Date: 02/23/2021   Universal Protocol:     Consent Given By: the patient  Position: PRONE  Additional Comments: Vital signs were monitored before and after the procedure. Patient was prepped and draped in the usual sterile  fashion. The correct patient, procedure, and site was verified.   Injection Procedure Details:   Procedure diagnoses: Lumbar radiculopathy [M54.16]   Meds Administered:  Meds ordered this encounter  Medications   methylPREDNISolone acetate (DEPO-MEDROL) injection 80 mg     Laterality: Right  Location/Site:  L5-S1  Needle: 3.5 in., 20 ga. Tuohy  Needle Placement: Paramedian epidural  Findings:   -Comments: Excellent flow of contrast into the epidural space.  Procedure Details: Using a paramedian approach from the side mentioned above, the region overlying the inferior lamina was localized under fluoroscopic visualization and the soft tissues overlying this structure were infiltrated with 4 ml. of 1% Lidocaine without Epinephrine. The Tuohy needle was inserted into the epidural space using a paramedian approach.   The epidural space was localized using loss of resistance along with counter oblique bi-planar fluoroscopic views.  After negative aspirate for air, blood, and CSF, a 2 ml. volume of Isovue-250 was injected into the epidural space and the flow of contrast was observed. Radiographs were obtained for documentation purposes.    The injectate was administered into the level noted above.   Additional Comments:  The patient tolerated the procedure well Dressing: 2 x 2 sterile gauze and Band-Aid    Post-procedure details: Patient was observed during the procedure. Post-procedure instructions were reviewed.  Patient left the clinic in stable condition.    Clinical History: No specialty comments available.     Objective:  VS:  HT:     WT:  BMI:      BP:119/77   HR:68bpm   TEMP: ( )   RESP:  Physical Exam Vitals and nursing note reviewed.  Constitutional:      General: He is not in acute distress.    Appearance: Normal appearance. He is not ill-appearing.  HENT:     Head: Normocephalic and atraumatic.     Right Ear: External ear normal.     Left Ear: External  ear normal.     Nose: No congestion.  Eyes:     Extraocular Movements: Extraocular movements intact.  Cardiovascular:     Rate and Rhythm: Normal rate.     Pulses: Normal pulses.  Pulmonary:     Effort: Pulmonary effort is normal. No respiratory distress.  Abdominal:     General: There is no distension.     Palpations: Abdomen is soft.  Musculoskeletal:        General: No tenderness or signs of injury.     Cervical back: Neck supple.     Right lower leg: No edema.     Left lower leg: No edema.     Comments: Patient has good distal strength without clonus.  Skin:    Findings: No erythema or rash.  Neurological:     General: No focal deficit present.     Mental Status: He is alert and oriented to person, place, and time.     Sensory: No sensory deficit.     Motor: No weakness or abnormal muscle tone.     Coordination: Coordination normal.  Psychiatric:        Mood and Affect: Mood normal.        Behavior: Behavior normal.     Imaging: No results found.

## 2021-02-23 NOTE — Progress Notes (Signed)
Pt state lower back pain that travels down his right leg. Pt state laying down and trying to rest makes the pain worse. Pt state he uses heat to help ease his pain.  Numeric Pain Rating Scale and Functional Assessment Average Pain 4   In the last MONTH (on 0-10 scale) has pain interfered with the following?  1. General activity like being  able to carry out your everyday physical activities such as walking, climbing stairs, carrying groceries, or moving a chair?  Rating(10)   +Driver, -BT, -Dye Allergies.

## 2021-02-23 NOTE — Patient Instructions (Signed)

## 2021-03-24 ENCOUNTER — Encounter: Payer: Self-pay | Admitting: Orthopaedic Surgery

## 2021-03-24 ENCOUNTER — Ambulatory Visit: Payer: BC Managed Care – PPO | Admitting: Orthopaedic Surgery

## 2021-03-24 ENCOUNTER — Other Ambulatory Visit: Payer: Self-pay

## 2021-03-24 VITALS — BP 120/71 | HR 71 | Ht 66.0 in | Wt 157.0 lb

## 2021-03-24 DIAGNOSIS — M5126 Other intervertebral disc displacement, lumbar region: Secondary | ICD-10-CM | POA: Diagnosis not present

## 2021-03-24 NOTE — Progress Notes (Signed)
Office Visit Note   Patient: Seth Bates           Date of Birth: 1980-02-09           MRN: [id number] Visit Date: 03/24/2021              Requested by: Tower, Wynelle Fanny, MD [place],  [place] [id number] PCP: Abner Greenspan, MD   Assessment & Plan: Visit Diagnoses:  1. Protrusion of lumbar intervertebral disc     Plan: Patient is better after the epidural he still occasionally has sharp pain when he is turning over trying to go to sleep or with certain motions.  She has not missed any time from work.  I will check him back in a month.  MRI scan and pathophysiology was again reviewed and discussed in detail.  Follow-Up Instructions: Return in about 1 month (around 04/21/2021).   Orders:  No orders of the defined types were placed in this encounter.  No orders of the defined types were placed in this encounter.     Procedures: No procedures performed   Clinical Data: No additional findings.   Subjective: Chief Complaint  Patient presents with   Lower Back - Pain, Follow-up    HPI 41 year old male returns post lumbar epidural for L4-5 disc protrusion single level problem.  He states his leg is better he still has some problems at night he noticed some problems with headache but his blood pressure has been stable.  No problems with headache when he took the prednisone 10 mg Dosepak.  He states the epidurals made him significantly more comfortable during the day.  No associated bowel or bladder symptoms  Review of Systems 14 point systems noncontributory.   Objective: Vital Signs: BP 120/71    Pulse 71    Ht 5\' 6"  (1.676 m)    Wt 157 lb (71.2 kg)    BMI 25.34 kg/m   Physical Exam Constitutional:      Appearance: He is well-developed.  HENT:     Head: Normocephalic and atraumatic.     Right Ear: External ear normal.     Left Ear: External ear normal.  Eyes:     Pupils: Pupils are equal, round, and reactive to light.  Neck:     Thyroid:  No thyromegaly.     Trachea: No tracheal deviation.  Cardiovascular:     Rate and Rhythm: Normal rate.  Pulmonary:     Effort: Pulmonary effort is normal.     Breath sounds: No wheezing.  Abdominal:     General: Bowel sounds are normal.     Palpations: Abdomen is soft.  Musculoskeletal:     Cervical back: Neck supple.  Skin:    General: Skin is warm and dry.     Capillary Refill: Capillary refill takes less than 2 seconds.  Neurological:     Mental Status: He is alert and oriented to person, place, and time.  Psychiatric:        Behavior: Behavior normal.        Thought Content: Thought content normal.        Judgment: Judgment normal.    Ortho Exam negative logroll normal gait he can get from sitting to standing.  Some pain with straight leg raising on the right 90 degrees.  Specialty Comments:  No specialty comments available.  Imaging: No results found.   PMFS History: Patient Active Problem List   Diagnosis Date Noted  Protrusion of lumbar intervertebral disc 01/27/2021   Acid reflux 09/24/2020   Other intervertebral disc degeneration, lumbar region 03/25/2020   Hyperlipidemia 03/19/2020   Elevated ALT measurement 03/19/2020   Hypertension 03/05/2020   Chronic headache 03/05/2020   Chronic bilateral low back pain with bilateral sciatica 03/04/2020   Vertigo 02/07/2012   Allergic rhinitis    SHOULDER JOINT INSTABILITY 08/29/2008   Past Medical History:  Diagnosis Date   Allergic rhinitis     No family history on file.  No past surgical history on file. Social History   Occupational History   Occupation: Teacher, adult education  Tobacco Use   Smoking status: Former    Packs/day: 0.25    Types: Cigarettes    Quit date: 11/01/2016    Years since quitting: 4.3   Smokeless tobacco: Never  Vaping Use   Vaping Use: Never used  Substance and Sexual Activity   Alcohol use: No    Alcohol/week: 0.0 standard drinks   Drug use: No   Sexual activity: Not on file

## 2021-04-10 ENCOUNTER — Other Ambulatory Visit: Payer: Self-pay | Admitting: Family Medicine

## 2021-04-10 NOTE — Telephone Encounter (Signed)
?  Encourage patient to contact the pharmacy for refills or they can request refills through [facility] ? ?LAST APPOINTMENT DATE:  Please schedule appointment if longer than 1 year ? ?NEXT APPOINTMENT DATE: ? ?MEDICATION:metoprolol succinate (TOPROL-XL) 50 MG 24 hr tablet ? ?Is the patient out of medication?  ? ?PHARMACY:[id number] [place], [place] - [address]  ? ?Let patient know to contact pharmacy at the end of the day to make sure medication is ready. ? ?Please notify patient to allow 48-72 hours to process ? ?CLINICAL FILLS OUT ALL BELOW:  ? ?LAST REFILL: ? ?QTY: ? ?REFILL DATE: ? ? ? ?OTHER COMMENTS:  ? ? ?Okay for refill? ? ?Please advise ? ? ? ? ?

## 2021-04-21 ENCOUNTER — Ambulatory Visit (INDEPENDENT_AMBULATORY_CARE_PROVIDER_SITE_OTHER): Payer: BC Managed Care – PPO | Admitting: Orthopaedic Surgery

## 2021-04-21 ENCOUNTER — Other Ambulatory Visit: Payer: Self-pay

## 2021-04-21 ENCOUNTER — Encounter: Payer: Self-pay | Admitting: Orthopaedic Surgery

## 2021-04-21 VITALS — BP 121/74 | HR 65 | Ht 66.0 in | Wt 154.0 lb

## 2021-04-21 DIAGNOSIS — M5126 Other intervertebral disc displacement, lumbar region: Secondary | ICD-10-CM | POA: Diagnosis not present

## 2021-04-24 NOTE — Progress Notes (Signed)
? ?Office Visit Note ?  ?Patient: Seth Bates           ?Date of Birth: January 07, 1981           ?MRN: [id number] ?Visit Date: 04/21/2021 ?             ?Requested by: Tower, Wynelle Fanny, MD ?[address] ?[place],  [place] [address] ?PCP: Abner Greenspan, MD ? ? ?Assessment & Plan: ?Visit Diagnoses:  ?1. Protrusion of lumbar intervertebral disc   ? ? ?Plan: Patient has disc protrusion with moderate lateral recess stenosis.  Having more symptoms on the right leg than left.  We will set him up for epidural injection with Dr. Ernestina Patches for second injection. ? ?Follow-Up Instructions:   He can follow-up with me he has ongoing symptoms. ? ?Orders:  ?Orders Placed This Encounter  ?Procedures  ? Ambulatory referral to Physical Medicine Rehab  ? ?No orders of the defined types were placed in this encounter. ? ? ? ? Procedures: ?No procedures performed ? ? ?Clinical Data: ?No additional findings. ? ? ?Subjective: ?Chief Complaint  ?Patient presents with  ? Lower Back - Pain, Follow-up  ? ? ?HPI 41 year old male returns he states about a week after his appointment on 03/24/2021 he had increasing pain in his back.  Patient had L4-5 disc protrusion single level problem and had an epidural that gave him good relief for many weeks.  He denies associated bowel or bladder symptoms.  He states he was much more comfortable during the day after the epidural and is requesting repeat epidural. ? ?Review of Systems 14 point system unchanged from 03/24/2021. ? ? ?Objective: ?Vital Signs: BP 121/74   Pulse 65   Ht '5\' 6"'$  (1.676 m)   Wt 154 lb (69.9 kg)   BMI 24.86 kg/m?  ? ?Physical Exam ?Constitutional:   ?   Appearance: He is well-developed.  ?HENT:  ?   Head: Normocephalic and atraumatic.  ?   Right Ear: External ear normal.  ?   Left Ear: External ear normal.  ?Eyes:  ?   Pupils: Pupils are equal, round, and reactive to light.  ?Neck:  ?   Thyroid: No thyromegaly.  ?   Trachea: No tracheal deviation.  ?Cardiovascular:  ?   Rate and  Rhythm: Normal rate.  ?Pulmonary:  ?   Effort: Pulmonary effort is normal.  ?   Breath sounds: No wheezing.  ?Abdominal:  ?   General: Bowel sounds are normal.  ?   Palpations: Abdomen is soft.  ?Musculoskeletal:  ?   Cervical back: Neck supple.  ?Skin: ?   General: Skin is warm and dry.  ?   Capillary Refill: Capillary refill takes less than 2 seconds.  ?Neurological:  ?   Mental Status: He is alert and oriented to person, place, and time.  ?Psychiatric:     ?   Behavior: Behavior normal.     ?   Thought Content: Thought content normal.     ?   Judgment: Judgment normal.  ? ? ?Ortho Exam patient has sciatic notch tenderness more on the right than the left.  Some discomfort straight leg raising 90 degrees.  Anterior tib is active takes good resistance. ? ?Specialty Comments:  ?No specialty comments available. ? ?Imaging: ?CLINICAL DATA:  Chronic low back pain radiating to the right ?buttocks and legs. ?  ?EXAM: ?MRI LUMBAR SPINE WITHOUT CONTRAST ?  ?TECHNIQUE: ?Multiplanar, multisequence MR imaging of the lumbar spine was ?performed.  No intravenous contrast was administered. ?  ?COMPARISON:  Lumbar spine radiographs 01/29/2020 ?  ?FINDINGS: ?Segmentation: Transitional lumbosacral anatomy with partial ?sacralization of L5 on the left. ?  ?Alignment:  Normal. ?  ?Vertebrae: No fracture, suspicious osseous lesion, or significant ?marrow edema. ?  ?Conus medullaris and cauda equina: Conus extends to the T12-L1 ?level. Conus and cauda equina appear normal. ?  ?Paraspinal and other soft tissues: Unremarkable. ?  ?Disc levels: ?  ?L1-2 through L3-4: Negative. ?  ?L4-5: Disc desiccation and mild disc space narrowing. Disc bulging ?and a small left paracentral to subarticular disc protrusion result ?in mild-to-moderate left lateral recess stenosis and minimal ?bilateral neural foraminal narrowing without spinal stenosis. ?  ?L5-S1: Negative. ?  ?IMPRESSION: ?Single level disc degeneration at L4-5 with a small disc  protrusion ?resulting in mild-to-moderate left lateral recess stenosis. No ?right-sided neural impingement identified. ?  ?  ?Electronically Signed ?  By: Logan Bores M.D. ?  On: 03/23/2020 19:39 ? ? ?PMFS History: ?Patient Active Problem List  ? Diagnosis Date Noted  ? Protrusion of lumbar intervertebral disc 01/27/2021  ? Acid reflux 09/24/2020  ? Other intervertebral disc degeneration, lumbar region 03/25/2020  ? Hyperlipidemia 03/19/2020  ? Elevated ALT measurement 03/19/2020  ? Hypertension 03/05/2020  ? Chronic headache 03/05/2020  ? Chronic bilateral low back pain with bilateral sciatica 03/04/2020  ? Vertigo 02/07/2012  ? Allergic rhinitis   ? SHOULDER JOINT INSTABILITY 08/29/2008  ? ?Past Medical History:  ?Diagnosis Date  ? Allergic rhinitis   ?  ?No family history on file.  ?No past surgical history on file. ?Social History  ? ?Occupational History  ? Occupation: Teacher, adult education  ?Tobacco Use  ? Smoking status: Former  ?  Packs/day: 0.25  ?  Types: Cigarettes  ?  Quit date: 11/01/2016  ?  Years since quitting: 4.4  ? Smokeless tobacco: Never  ?Vaping Use  ? Vaping Use: Never used  ?Substance and Sexual Activity  ? Alcohol use: No  ?  Alcohol/week: 0.0 standard drinks  ? Drug use: No  ? Sexual activity: Not on file  ? ? ? ? ? ? ?

## 2021-05-27 ENCOUNTER — Ambulatory Visit: Payer: BC Managed Care – PPO | Admitting: Physical Medicine and Rehabilitation

## 2021-05-27 ENCOUNTER — Ambulatory Visit: Payer: Self-pay

## 2021-05-27 ENCOUNTER — Encounter: Payer: Self-pay | Admitting: Physical Medicine and Rehabilitation

## 2021-05-27 VITALS — BP 136/9 | HR 76

## 2021-05-27 DIAGNOSIS — M5416 Radiculopathy, lumbar region: Secondary | ICD-10-CM

## 2021-05-27 MED ORDER — METHYLPREDNISOLONE ACETATE 80 MG/ML IJ SUSP
80.0000 mg | Freq: Once | INTRAMUSCULAR | Status: AC
  Administered 2021-05-27: 80 mg

## 2021-05-27 NOTE — Progress Notes (Signed)
Pt state lower back pain that travels to his right leg. Pt state standing and sitting makes the pain worse. Pt state he uses heat to help ease his pain. ? ?Numeric Pain Rating Scale and Functional Assessment ?Average Pain 2 ? ? ?In the last MONTH (on 0-10 scale) has pain interfered with the following? ? ?1. General activity like being  able to carry out your everyday physical activities such as walking, climbing stairs, carrying groceries, or moving a chair?  ?Rating(10) ? ? ?+Driver, -BT, -Dye Allergies. ? ?

## 2021-05-27 NOTE — Patient Instructions (Signed)

## 2021-06-11 NOTE — Procedures (Signed)
Lumbar Epidural Steroid Injection - Interlaminar Approach with Fluoroscopic Guidance ? ?Patient: Seth Bates      ?Date of Birth: 1981-01-06 ?MRN: [id number] ?PCP: Abner Greenspan, MD      ?Visit Date: 05/27/2021 ?  ?Universal Protocol:    ? ?Consent Given By: the patient ? ?Position: PRONE ? ?Additional Comments: ?Vital signs were monitored before and after the procedure. ?Patient was prepped and draped in the usual sterile fashion. ?The correct patient, procedure, and site was verified. ? ? ?Injection Procedure Details:  ? ?Procedure diagnoses: Lumbar radiculopathy [M54.16]  ? ?Meds Administered:  ?Meds ordered this encounter  ?Medications  ? methylPREDNISolone acetate (DEPO-MEDROL) injection 80 mg  ?  ? ?Laterality: Right ? ?Location/Site:  L5-S1 ? ?Needle: 3.5 in., 20 ga. Tuohy ? ?Needle Placement: Paramedian epidural ? ?Findings:  ? -Comments: Excellent flow of contrast into the epidural space. ? ?Procedure Details: ?Using a paramedian approach from the side mentioned above, the region overlying the inferior lamina was localized under fluoroscopic visualization and the soft tissues overlying this structure were infiltrated with 4 ml. of 1% Lidocaine without Epinephrine. The Tuohy needle was inserted into the epidural space using a paramedian approach.  ? ?The epidural space was localized using loss of resistance along with counter oblique bi-planar fluoroscopic views.  After negative aspirate for air, blood, and CSF, a 2 ml. volume of Isovue-250 was injected into the epidural space and the flow of contrast was observed. Radiographs were obtained for documentation purposes.   ? ?The injectate was administered into the level noted above. ? ? ?Additional Comments:  ?The patient tolerated the procedure well ?Dressing: 2 x 2 sterile gauze and Band-Aid ?  ? ?Post-procedure details: ?Patient was observed during the procedure. ?Post-procedure instructions were reviewed. ? ?Patient left the clinic in stable  condition. ?

## 2021-06-11 NOTE — Progress Notes (Signed)
? ?Seth Bates - 41 y.o. male MRN [id number]  Date of birth: Oct 25, 1980 ? ?Office Visit Note: ?Visit Date: 05/27/2021 ?PCP: Abner Greenspan, MD ?Referred by: Tower, Wynelle Fanny, MD ? ?Subjective: ?Chief Complaint  ?Patient presents with  ? Lower Back - Pain  ? Right Leg - Pain  ? ?HPI:  [name] is a 41 y.o. male who comes in today for planned repeat Right L5-S1  Lumbar Interlaminar epidural steroid injection with fluoroscopic guidance.  The patient has failed conservative care including home exercise, medications, time and activity modification.  This injection will be diagnostic and hopefully therapeutic.  Please see requesting physician notes for further details and justification. Patient received more than 50% pain relief from prior injection.  ? ?Referring: Dr. Rodell Perna ? ?ROS Otherwise per HPI. ? ?Assessment & Plan: ?Visit Diagnoses:  ?  ICD-10-CM   ?1. Lumbar radiculopathy  M54.16 XR C-ARM NO REPORT  ?  Epidural Steroid injection  ?  methylPREDNISolone acetate (DEPO-MEDROL) injection 80 mg  ?  ?  ?Plan: No additional findings.  ? ?Meds & Orders:  ?Meds ordered this encounter  ?Medications  ? methylPREDNISolone acetate (DEPO-MEDROL) injection 80 mg  ?  ?Orders Placed This Encounter  ?Procedures  ? XR C-ARM NO REPORT  ? Epidural Steroid injection  ?  ?Follow-up: Return for visit to requesting provider as needed.  ? ?Procedures: ?No procedures performed  ?Lumbar Epidural Steroid Injection - Interlaminar Approach with Fluoroscopic Guidance ? ?Patient: Seth Bates      ?Date of Birth: 1980-08-10 ?MRN: [id number] ?PCP: Abner Greenspan, MD      ?Visit Date: 05/27/2021 ?  ?Universal Protocol:    ? ?Consent Given By: the patient ? ?Position: PRONE ? ?Additional Comments: ?Vital signs were monitored before and after the procedure. ?Patient was prepped and draped in the usual sterile fashion. ?The correct patient, procedure, and site was verified. ? ? ?Injection Procedure Details:  ? ?Procedure  diagnoses: Lumbar radiculopathy [M54.16]  ? ?Meds Administered:  ?Meds ordered this encounter  ?Medications  ? methylPREDNISolone acetate (DEPO-MEDROL) injection 80 mg  ?  ? ?Laterality: Right ? ?Location/Site:  L5-S1 ? ?Needle: 3.5 in., 20 ga. Tuohy ? ?Needle Placement: Paramedian epidural ? ?Findings:  ? -Comments: Excellent flow of contrast into the epidural space. ? ?Procedure Details: ?Using a paramedian approach from the side mentioned above, the region overlying the inferior lamina was localized under fluoroscopic visualization and the soft tissues overlying this structure were infiltrated with 4 ml. of 1% Lidocaine without Epinephrine. The Tuohy needle was inserted into the epidural space using a paramedian approach.  ? ?The epidural space was localized using loss of resistance along with counter oblique bi-planar fluoroscopic views.  After negative aspirate for air, blood, and CSF, a 2 ml. volume of Isovue-250 was injected into the epidural space and the flow of contrast was observed. Radiographs were obtained for documentation purposes.   ? ?The injectate was administered into the level noted above. ? ? ?Additional Comments:  ?The patient tolerated the procedure well ?Dressing: 2 x 2 sterile gauze and Band-Aid ?  ? ?Post-procedure details: ?Patient was observed during the procedure. ?Post-procedure instructions were reviewed. ? ?Patient left the clinic in stable condition.  ? ?Clinical History: ?No specialty comments available.  ? ? ? ?Objective:  VS:  HT:    WT:   BMI:     BP:(!) 136/9  HR:76bpm  TEMP: ( )  RESP:  ?Physical Exam ?Vitals and nursing note reviewed.  ?  Constitutional:   ?   General: He is not in acute distress. ?   Appearance: Normal appearance. He is not ill-appearing.  ?HENT:  ?   Head: Normocephalic and atraumatic.  ?   Right Ear: External ear normal.  ?   Left Ear: External ear normal.  ?   Nose: No congestion.  ?Eyes:  ?   Extraocular Movements: Extraocular movements intact.   ?Cardiovascular:  ?   Rate and Rhythm: Normal rate.  ?   Pulses: Normal pulses.  ?Pulmonary:  ?   Effort: Pulmonary effort is normal. No respiratory distress.  ?Abdominal:  ?   General: There is no distension.  ?   Palpations: Abdomen is soft.  ?Musculoskeletal:     ?   General: No tenderness or signs of injury.  ?   Cervical back: Neck supple.  ?   Right lower leg: No edema.  ?   Left lower leg: No edema.  ?   Comments: Patient has good distal strength without clonus.  ?Skin: ?   Findings: No erythema or rash.  ?Neurological:  ?   General: No focal deficit present.  ?   Mental Status: He is alert and oriented to person, place, and time.  ?   Sensory: No sensory deficit.  ?   Motor: No weakness or abnormal muscle tone.  ?   Coordination: Coordination normal.  ?Psychiatric:     ?   Mood and Affect: Mood normal.     ?   Behavior: Behavior normal.  ?  ? ?Imaging: ?No results found. ?

## 2021-07-07 ENCOUNTER — Other Ambulatory Visit: Payer: Self-pay | Admitting: Family Medicine

## 2021-07-13 ENCOUNTER — Ambulatory Visit: Payer: BC Managed Care – PPO | Admitting: Family Medicine

## 2021-07-13 ENCOUNTER — Encounter: Payer: Self-pay | Admitting: Family Medicine

## 2021-07-13 VITALS — BP 130/80 | HR 61 | Temp 98.2°F | Ht 66.0 in | Wt 158.0 lb

## 2021-07-13 DIAGNOSIS — T753XXA Motion sickness, initial encounter: Secondary | ICD-10-CM | POA: Diagnosis not present

## 2021-07-13 DIAGNOSIS — K219 Gastro-esophageal reflux disease without esophagitis: Secondary | ICD-10-CM

## 2021-07-13 DIAGNOSIS — I10 Essential (primary) hypertension: Secondary | ICD-10-CM | POA: Diagnosis not present

## 2021-07-13 DIAGNOSIS — E7800 Pure hypercholesterolemia, unspecified: Secondary | ICD-10-CM | POA: Diagnosis not present

## 2021-07-13 MED ORDER — OMEPRAZOLE 20 MG PO CPDR: 20.0000 mg | DELAYED_RELEASE_CAPSULE | Freq: Every day | ORAL | 3 refills | Status: DC

## 2021-07-13 MED ORDER — METOPROLOL SUCCINATE ER 50 MG PO TB24
ORAL_TABLET | ORAL | 3 refills | Status: DC
Start: 2021-07-13 — End: 2022-10-05

## 2021-07-13 MED ORDER — SCOPOLAMINE 1 MG/3DAYS TD PT72: 1.0000 | MEDICATED_PATCH | TRANSDERMAL | 1 refills | Status: DC

## 2021-07-13 NOTE — Patient Instructions (Signed)
Continue your current medicines   Eat a healthy diet   Take care of yourself   Lab today   Try the scop patch for sea sickness

## 2021-07-13 NOTE — Assessment & Plan Note (Signed)
Good control as long as he takes omeprazole 20 mg daily  occ flare (disc foods to avoid)

## 2021-07-13 NOTE — Assessment & Plan Note (Signed)
Disc goals for lipids and reasons to control them Rev last labs with pt Rev low sat fat diet in detail Has cut down on fatty dairy and red meat and fried foods  Lipid panel ordered

## 2021-07-13 NOTE — Assessment & Plan Note (Signed)
bp in fair control at this time  BP Readings from Last 1 Encounters:  07/13/21 130/80   No changes needed Most recent labs reviewed  Disc lifstyle change with low sodium diet and exercise  Plan to continue metoprolol xl 50 mg daily

## 2021-07-13 NOTE — Progress Notes (Signed)
Subjective:    Patient ID: Seth Bates, male    DOB: February 04, 1980, 41 y.o.   MRN: [id number]  HPI Pt presents for f/u of HTN and chronic health problems  Wt Readings from Last 3 Encounters:  07/13/21 158 lb (71.7 kg)  04/21/21 154 lb (69.9 kg)  03/24/21 157 lb (71.2 kg)   25.50 kg/m  Working  Back problems  Tries not to take medicine every day  Only taking voltaren when he has to  [place] him stomach aches   Eats a healthy diet when he can  Better than it used to be  Tries not to eat a lot of salt     HTN bp is stable today  No cp or palpitations or headaches or edema  No side effects to medicines  BP Readings from Last 3 Encounters:  07/13/21 120/90  05/27/21 (!) 136/9  04/21/21 121/74     Metoprolol xl 50 mg daily Was helping headaches also  They got a bit worse after his epidural shots /steroid   Now fine again   Thinks bp has been ok    Pulse Readings from Last 3 Encounters:  07/13/21 61  05/27/21 76  04/21/21 65     Acid reflux Omeprazole 20 mg daily in am  This is well controlled   His IBS is worse however   Needs something for sea sickness  Going to [facility] and beach next month    Hyperlipidemia Lab Results  Component Value Date   CHOL 209 (H) 05/19/2020   HDL 38.20 (L) 05/19/2020   LDLDIRECT 139.0 05/19/2020   TRIG 217.0 (H) 05/19/2020   CHOLHDL 5 05/19/2020   Cut back on high fat dairy  Cut back some on red meat Bacon 2 times per month  Not a lot of fried food   Sees Dr Lorin Mercy for lumbar radiculopathy Epidural inj from Dr Ernestina Patches in April  Had had 2  Helped some  May need surgery in the future   Per pt has bulging disc  Has done some PT     Lab Results  Component Value Date   ALT 63 (H) 05/19/2020   AST 29 05/19/2020   ALKPHOS 66 05/19/2020   BILITOT 1.0 05/19/2020     Patient Active Problem List   Diagnosis Date Noted   Motion sickness 07/13/2021   Protrusion of lumbar intervertebral disc 01/27/2021   Acid  reflux 09/24/2020   Other intervertebral disc degeneration, lumbar region 03/25/2020   Hyperlipidemia 03/19/2020   Elevated ALT measurement 03/19/2020   Hypertension 03/05/2020   Chronic headache 03/05/2020   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 No pertinent surgical history. Social History   Tobacco Use   Smoking status: Former    Packs/day: 0.25    Types: Cigarettes    Quit date: 11/01/2016    Years since quitting: 4.6   Smokeless tobacco: Never  Vaping Use   Vaping Use: Never used  Substance Use Topics   Alcohol use: No    Alcohol/week: 0.0 standard drinks of alcohol   Drug use: No   History reviewed. No pertinent family history. Allergies  Allergen Reactions   Robinul [Glycopyrrolate]     Dizzy, abdominal distention   Current Outpatient Medications on File Prior to Visit  Medication Sig Dispense  Refill   cyclobenzaprine (FLEXERIL) 10 MG tablet Take 1 tablet (10 mg total) by mouth at bedtime as needed for muscle spasms. 30 tablet 1   dicyclomine (BENTYL) 10 MG capsule Take 1 capsule (10 mg total) by mouth 3 (three) times daily as needed for spasms. 90 capsule 11   meclizine (ANTIVERT) 25 MG tablet Take 1 tablet (25 mg total) by mouth 3 (three) times daily as needed for dizziness. Caution of sedation 30 tablet 1   triamcinolone (NASACORT) 55 MCG/ACT AERO nasal inhaler Place 2 sprays into the nose daily. 1 each 10   No current facility-administered medications on file prior to visit.     Review of Systems  Constitutional:  Negative for activity change, appetite change, fatigue, fever and unexpected weight change.  HENT:  Negative for congestion, rhinorrhea, sore throat and trouble swallowing.   Eyes:  Negative for pain, redness, itching and visual disturbance.  Respiratory:   Negative for cough, chest tightness, shortness of breath and wheezing.   Cardiovascular:  Negative for chest pain and palpitations.  Gastrointestinal:  Negative for abdominal pain, blood in stool, constipation, diarrhea and nausea.       Occasional heartburn   Endocrine: Negative for cold intolerance, heat intolerance, polydipsia and polyuria.  Genitourinary:  Negative for difficulty urinating, dysuria, frequency and urgency.  Musculoskeletal:  Positive for back pain. Negative for arthralgias, joint swelling and myalgias.  Skin:  Negative for pallor and rash.  Neurological:  Negative for dizziness, tremors, weakness, numbness and headaches.  Hematological:  Negative for adenopathy. Does not bruise/bleed easily.  Psychiatric/Behavioral:  Negative for decreased concentration and dysphoric mood. The patient is not nervous/anxious.        Objective:   Physical Exam Constitutional:      General: He is not in acute distress.    Appearance: Normal appearance. He is well-developed and normal weight. He is not ill-appearing or diaphoretic.  HENT:     Head: Normocephalic and atraumatic.  Eyes:     General: No scleral icterus.    Conjunctiva/sclera: Conjunctivae normal.     Pupils: Pupils are equal, round, and reactive to light.  Neck:     Thyroid: No thyromegaly.     Vascular: No carotid bruit or JVD.  Cardiovascular:     Rate and Rhythm: Normal rate and regular rhythm.     Pulses: Normal pulses.     Heart sounds: Normal heart sounds.     No gallop.  Pulmonary:     Effort: Pulmonary effort is normal. No respiratory distress.     Breath sounds: Normal breath sounds. No wheezing or rales.  Abdominal:     General: There is no distension or abdominal bruit.     Palpations: Abdomen is soft. There is no hepatomegaly, splenomegaly or pulsatile mass.     Tenderness: There is no abdominal tenderness. There is no guarding or rebound.  Musculoskeletal:     Cervical back: Normal range of motion  and neck supple.     Right lower leg: No edema.     Left lower leg: No edema.  Lymphadenopathy:     Cervical: No cervical adenopathy.  Skin:    General: Skin is warm and dry.     Coloration: Skin is not pale.     Findings: No rash.  Neurological:     Mental Status: He is alert.     Coordination: Coordination normal.     Deep Tendon Reflexes: Reflexes are normal and symmetric. Reflexes normal.  Psychiatric:  Mood and Affect: Mood normal.           Assessment & Plan:   Problem List Items Addressed This Visit       Cardiovascular and Mediastinum   Hypertension - Primary    bp in fair control at this time  BP Readings from Last 1 Encounters:  07/13/21 130/80  No changes needed Most recent labs reviewed  Disc lifstyle change with low sodium diet and exercise  Plan to continue metoprolol xl 50 mg daily       Relevant Medications   metoprolol succinate (TOPROL-XL) 50 MG 24 hr tablet   Other Relevant Orders   Comprehensive metabolic panel   CBC with Differential/Platelet   TSH   Lipid panel     Digestive   Acid reflux    Good control as long as he takes omeprazole 20 mg daily  occ flare (disc foods to avoid)          Relevant Medications   omeprazole (PRILOSEC) 20 MG capsule   scopolamine (TRANSDERM-SCOP) 1 MG/3DAYS     Other   Hyperlipidemia    Disc goals for lipids and reasons to control them Rev last labs with pt Rev low sat fat diet in detail Has cut down on fatty dairy and red meat and fried foods  Lipid panel ordered       Relevant Medications   metoprolol succinate (TOPROL-XL) 50 MG 24 hr tablet   Other Relevant Orders   Lipid panel   Motion sickness    Pt gets motion sick in car and on water [place]  Disc opt for treatment  Px scop patch to wear every 72 h  Info printed Disc poss side effects including sedation/dry mouth

## 2021-07-13 NOTE — Assessment & Plan Note (Signed)
Pt gets motion sick in car and on water [place]  Disc opt for treatment  Px scop patch to wear every 72 h  Info printed Disc poss side effects including sedation/dry mouth

## 2021-07-14 LAB — CBC WITH DIFFERENTIAL/PLATELET
Basophils Absolute: 0 10*3/uL (ref 0.0–0.1)
Basophils Relative: 0.5 % (ref 0.0–3.0)
Eosinophils Absolute: 0.5 10*3/uL (ref 0.0–0.7)
Eosinophils Relative: 5.6 % — ABNORMAL HIGH (ref 0.0–5.0)
HCT: 47.9 % (ref 39.0–52.0)
Hemoglobin: 16.1 g/dL (ref 13.0–17.0)
Lymphocytes Relative: 27.2 % (ref 12.0–46.0)
Lymphs Abs: 2.5 10*3/uL (ref 0.7–4.0)
MCHC: 33.7 g/dL (ref 30.0–36.0)
MCV: 91.7 fl (ref 78.0–100.0)
Monocytes Absolute: 0.8 10*3/uL (ref 0.1–1.0)
Monocytes Relative: 8.9 % (ref 3.0–12.0)
Neutro Abs: 5.3 10*3/uL (ref 1.4–7.7)
Neutrophils Relative %: 57.8 % (ref 43.0–77.0)
Platelets: 366 10*3/uL (ref 150.0–400.0)
RBC: 5.23 Mil/uL (ref 4.22–5.81)
RDW: 13.1 % (ref 11.5–15.5)
WBC: 9.1 10*3/uL (ref 4.0–10.5)

## 2021-07-14 LAB — COMPREHENSIVE METABOLIC PANEL
ALT: 41 U/L (ref 0–53)
AST: 20 U/L (ref 0–37)
Albumin: 4.7 g/dL (ref 3.5–5.2)
Alkaline Phosphatase: 66 U/L (ref 39–117)
BUN: 12 mg/dL (ref 6–23)
CO2: 30 mEq/L (ref 19–32)
Calcium: 9.8 mg/dL (ref 8.4–10.5)
Chloride: 101 mEq/L (ref 96–112)
Creatinine, Ser: 0.95 mg/dL (ref 0.40–1.50)
GFR: 99.6 mL/min (ref >60.00)
Glucose, Bld: 92 mg/dL (ref 70–99)
Potassium: 4.6 mEq/L (ref 3.5–5.1)
Sodium: 138 mEq/L (ref 135–145)
Total Bilirubin: 0.6 mg/dL (ref 0.2–1.2)
Total Protein: 6.8 g/dL (ref 6.0–8.3)

## 2021-07-14 LAB — LIPID PANEL
Cholesterol: 235 mg/dL — ABNORMAL HIGH (ref 0–200)
HDL: 36.1 mg/dL — ABNORMAL LOW (ref >39.00)
NonHDL: 199.02
Total CHOL/HDL Ratio: 7
Triglycerides: 274 mg/dL — ABNORMAL HIGH (ref 0.0–149.0)
VLDL: 54.8 mg/dL — ABNORMAL HIGH (ref 0.0–40.0)

## 2021-07-14 LAB — TSH: TSH: 1.39 u[IU]/mL (ref 0.35–5.50)

## 2021-07-14 LAB — LDL CHOLESTEROL, DIRECT: Direct LDL: 151 mg/dL

## 2021-11-23 ENCOUNTER — Telehealth: Payer: Self-pay | Admitting: Family Medicine

## 2021-11-23 MED ORDER — MECLIZINE HCL 25 MG PO TABS
25.0000 mg | ORAL_TABLET | Freq: Three times a day (TID) | ORAL | 0 refills | Status: DC | PRN
Start: 2021-11-23 — End: 2022-03-15

## 2021-11-23 NOTE — Telephone Encounter (Signed)
I assume he is having vertigo  Please check in with him I refilled 20   If not improving please f/u Give ER precautions  Thanks

## 2021-11-23 NOTE — Telephone Encounter (Signed)
Pt said he is having a flare up of vertigo. Advised him to f/u if no improvement and ER precautions given

## 2021-11-23 NOTE — Telephone Encounter (Signed)
Med refill was on 07/13/21, last filled on 09/24/20 #30 tab with 1 refills

## 2021-11-23 NOTE — Telephone Encounter (Signed)
  Encourage patient to contact the pharmacy for refills or they can request refills through [place]:  Please schedule appointment if longer than 1 year  NEXT APPOINTMENT DATE:  MEDICATION:meclizine (ANTIVERT) 25 MG tablet  Is the patient out of medication?   PHARMACY:[id number] [place], [place] - [address]    Let patient know to contact pharmacy at the end of the day to make sure medication is ready.  Please notify patient to allow 48-72 hours to process  CLINICAL FILLS OUT ALL BELOW:   LAST REFILL:  QTY:  REFILL DATE:    OTHER COMMENTS:    Okay for refill?  Please advise

## 2021-11-30 ENCOUNTER — Ambulatory Visit: Payer: BC Managed Care – PPO | Admitting: Family Medicine

## 2021-11-30 ENCOUNTER — Encounter: Payer: Self-pay | Admitting: Family Medicine

## 2021-11-30 VITALS — BP 118/86 | HR 70 | Temp 97.9°F | Ht 66.0 in | Wt 154.0 lb

## 2021-11-30 DIAGNOSIS — R1013 Epigastric pain: Secondary | ICD-10-CM

## 2021-11-30 DIAGNOSIS — K580 Irritable bowel syndrome with diarrhea: Secondary | ICD-10-CM | POA: Diagnosis not present

## 2021-11-30 DIAGNOSIS — K589 Irritable bowel syndrome without diarrhea: Secondary | ICD-10-CM | POA: Insufficient documentation

## 2021-11-30 NOTE — Progress Notes (Signed)
Subjective:    Patient ID: Seth Bates, male    DOB: Jun 28, 1980, 41 y.o.   MRN: [id number]  HPI Pt presents with GI symptoms   Wt Readings from Last 3 Encounters:  11/30/21 154 lb (69.9 kg)  07/13/21 158 lb (71.7 kg)  04/21/21 154 lb (69.9 kg)   24.86 kg/m  Has IBS  GI symptoms come and go   Gets nausea and weird pain in upper abdomen  Stool is more yellow in color / loose at times also   Also stomach pain every day -epigastric (aching , not a sharp pain, -goes into his hest ) almost like he is sea sick  One day it felt like burning (around belly button)  Occ sharp pain on the right side (does not last long(  No vomiting  Ho heartburn   Takes omeprazole for heartburn as needed  Has not needed in a month    Eating does not make it worse or better  Does not pair it with fatty foods (trying not to eat)    H/o GERD and IBS D in the past  Has seen Dr Fuller Plan  Dicyclomine (did not tolerate glycopyrrolate) - takes this when he needs it /did not needed for a while but June it got worse again   Alcohol - 2-3 beers per year  Not a drinker  No nsaids (got off of them years ago)    Colonosocpy 2016 with hyperplastic polyp   BP Readings from Last 3 Encounters:  11/30/21 118/86  07/13/21 130/80  05/27/21 (!) 136/9   Pulse Readings from Last 3 Encounters:  11/30/21 70  07/13/21 61  05/27/21 76   No fever No other symptoms  No new stress   Had some vertigo 2 weeks ago and got better  Motion sickness in the past   Patient Active Problem List   Diagnosis Date Noted   Epigastric pain 11/30/2021   IBS (irritable bowel syndrome) 11/30/2021   Motion sickness 07/13/2021   Protrusion of lumbar intervertebral disc 01/27/2021   Acid reflux 09/24/2020   Other intervertebral disc degeneration, lumbar region 03/25/2020   Hyperlipidemia 03/19/2020   Elevated ALT measurement 03/19/2020   Hypertension 03/05/2020   Chronic headache 03/05/2020   Chronic bilateral low  back pain with bilateral sciatica 03/04/2020   Vertigo 02/07/2012   Allergic rhinitis    SHOULDER JOINT INSTABILITY 08/29/2008   Past Medical History:  Diagnosis Date   Allergic rhinitis    History reviewed. No pertinent surgical history. Social History   Tobacco Use   Smoking status: Former    Packs/day: 0.25    Types: Cigarettes    Quit date: 11/01/2016    Years since quitting: 5.0   Smokeless tobacco: Never  Vaping Use   Vaping Use: Never used  Substance Use Topics   Alcohol use: No    Alcohol/week: 0.0 standard drinks of alcohol   Drug use: No   History reviewed. No pertinent family history. Allergies  Allergen Reactions   Robinul [Glycopyrrolate]     Dizzy, abdominal distention   Current Outpatient Medications on File Prior to Visit  Medication Sig Dispense Refill   cyclobenzaprine (FLEXERIL) 10 MG tablet Take 1 tablet (10 mg total) by mouth at bedtime as needed for muscle spasms. 30 tablet 1   dicyclomine (BENTYL) 10 MG capsule Take 1 capsule (10 mg total) by mouth 3 (three) times daily as needed for spasms. 90 capsule 11   meclizine (ANTIVERT) 25 MG tablet Take 1  tablet (25 mg total) by mouth 3 (three) times daily as needed for dizziness. Caution of sedation 20 tablet 0   metoprolol succinate (TOPROL-XL) 50 MG 24 hr tablet TAKE 1 TABLET BY MOUTH DAILY. TAKE WITH OR IMMEDIATELY FOLLOWING A MEAL. NEEDS OFFICE VISIT 90 tablet 3   omeprazole (PRILOSEC) 20 MG capsule Take 1 capsule (20 mg total) by mouth daily. 90 capsule 3   scopolamine (TRANSDERM-SCOP) 1 MG/3DAYS Place 1 patch (1.5 mg total) onto the skin every 3 (three) days. 4 patch 1   triamcinolone (NASACORT) 55 MCG/ACT AERO nasal inhaler Place 2 sprays into the nose daily. 1 each 10   No current facility-administered medications on file prior to visit.     Review of Systems  Constitutional:  Negative for activity change, appetite change, fatigue, fever and unexpected weight change.  HENT:  Negative for  congestion, rhinorrhea, sore throat and trouble swallowing.   Eyes:  Negative for pain, redness, itching and visual disturbance.  Respiratory:  Negative for cough, chest tightness, shortness of breath and wheezing.   Cardiovascular:  Negative for chest pain and palpitations.  Gastrointestinal:  Positive for abdominal pain, diarrhea and nausea. Negative for abdominal distention, anal bleeding, blood in stool, constipation, rectal pain and vomiting.       Occ bloating   Endocrine: Negative for cold intolerance, heat intolerance, polydipsia and polyuria.  Genitourinary:  Negative for difficulty urinating, dysuria, frequency and urgency.  Musculoskeletal:  Negative for arthralgias, joint swelling and myalgias.  Skin:  Negative for pallor and rash.  Neurological:  Negative for dizziness, tremors, weakness, numbness and headaches.  Hematological:  Negative for adenopathy. Does not bruise/bleed easily.  Psychiatric/Behavioral:  Negative for decreased concentration and dysphoric mood. The patient is not nervous/anxious.        Objective:   Physical Exam Constitutional:      General: He is not in acute distress.    Appearance: Normal appearance. He is well-developed and normal weight. He is not ill-appearing or diaphoretic.  HENT:     Head: Normocephalic and atraumatic.  Eyes:     General: No scleral icterus.    Conjunctiva/sclera: Conjunctivae normal.     Pupils: Pupils are equal, round, and reactive to light.  Cardiovascular:     Rate and Rhythm: Normal rate and regular rhythm.     Heart sounds: Normal heart sounds.  Pulmonary:     Effort: Pulmonary effort is normal. No respiratory distress.     Breath sounds: Normal breath sounds. No stridor. No wheezing, rhonchi or rales.  Abdominal:     General: Abdomen is flat. Bowel sounds are normal. There is no distension or abdominal bruit.     Palpations: Abdomen is soft. There is no hepatomegaly, splenomegaly, mass or pulsatile mass.      Tenderness: There is abdominal tenderness in the epigastric area. There is no right CVA tenderness, left CVA tenderness, guarding or rebound. Negative signs include Murphy's sign and McBurney's sign.  Musculoskeletal:     Cervical back: Normal range of motion and neck supple.  Lymphadenopathy:     Cervical: No cervical adenopathy.  Skin:    General: Skin is warm and dry.     Coloration: Skin is not pale.     Findings: No erythema.  Neurological:     Mental Status: He is alert.           Assessment & Plan:   Problem List Items Addressed This Visit       Digestive   IBS (  irritable bowel syndrome)    Has seen Dr Fuller Plan Takes dicyclomine prn  Stools are occ yellow if he has upper abd c/o recently         Other   Epigastric pain - Primary    Epigastric pain recently which is dull to burning in nature and not changed by eating  IBS bothers him occ also  Epigastric tenderness on exam  Gastritis is in the differential  Lab today  inst pt to start back on omeprazole 20 mg daily in am  F/u in 1 mo  Call earlier if worse or not improved       Relevant Orders   CBC with Differential/Platelet   Basic metabolic panel   Hepatic function panel   Lipase

## 2021-11-30 NOTE — Patient Instructions (Signed)
Labs today   Start back on omeprazole 20 mg daily  First dose when you get home Then take it each am   I think you may have gastritis  Too much acid in your stomach   Try and eat bland for the next week  Get away from cola (you may need a break)  Try ginger ale instead  Stay hydrated  Avoid nsaids  Avoid a lot of caffeine   Follow up in about a month  Let us know before then if you get worse at all

## 2021-11-30 NOTE — Assessment & Plan Note (Signed)
Has seen Dr Fuller Plan Takes dicyclomine prn  Stools are occ yellow if he has upper abd c/o recently

## 2021-11-30 NOTE — Assessment & Plan Note (Signed)
Epigastric pain recently which is dull to burning in nature and not changed by eating  IBS bothers him occ also  Epigastric tenderness on exam  Gastritis is in the differential  Lab today  inst pt to start back on omeprazole 20 mg daily in am  F/u in 1 mo  Call earlier if worse or not improved

## 2021-12-01 ENCOUNTER — Encounter: Payer: Self-pay | Admitting: *Deleted

## 2021-12-01 LAB — CBC WITH DIFFERENTIAL/PLATELET
Basophils Absolute: 0.1 10*3/uL (ref 0.0–0.1)
Basophils Relative: 1.1 % (ref 0.0–3.0)
Eosinophils Absolute: 0.5 10*3/uL (ref 0.0–0.7)
Eosinophils Relative: 5.4 % — ABNORMAL HIGH (ref 0.0–5.0)
HCT: 47.2 % (ref 39.0–52.0)
Hemoglobin: 16 g/dL (ref 13.0–17.0)
Lymphocytes Relative: 32.3 % (ref 12.0–46.0)
Lymphs Abs: 3 10*3/uL (ref 0.7–4.0)
MCHC: 33.9 g/dL (ref 30.0–36.0)
MCV: 89.6 fl (ref 78.0–100.0)
Monocytes Absolute: 0.9 10*3/uL (ref 0.1–1.0)
Monocytes Relative: 9.9 % (ref 3.0–12.0)
Neutro Abs: 4.8 10*3/uL (ref 1.4–7.7)
Neutrophils Relative %: 51.3 % (ref 43.0–77.0)
Platelets: 363 10*3/uL (ref 150.0–400.0)
RBC: 5.28 Mil/uL (ref 4.22–5.81)
RDW: 13.5 % (ref 11.5–15.5)
WBC: 9.3 10*3/uL (ref 4.0–10.5)

## 2021-12-01 LAB — BASIC METABOLIC PANEL
BUN: 9 mg/dL (ref 6–23)
CO2: 28 mEq/L (ref 19–32)
Calcium: 9.2 mg/dL (ref 8.4–10.5)
Chloride: 101 mEq/L (ref 96–112)
Creatinine, Ser: 1 mg/dL (ref 0.40–1.50)
GFR: 93.4 mL/min (ref >60.00)
Glucose, Bld: 83 mg/dL (ref 70–99)
Potassium: 4.2 mEq/L (ref 3.5–5.1)
Sodium: 137 mEq/L (ref 135–145)

## 2021-12-01 LAB — HEPATIC FUNCTION PANEL
ALT: 50 U/L (ref 0–53)
AST: 25 U/L (ref 0–37)
Albumin: 4.5 g/dL (ref 3.5–5.2)
Alkaline Phosphatase: 66 U/L (ref 39–117)
Bilirubin, Direct: 0.1 mg/dL (ref 0.0–0.3)
Total Bilirubin: 0.7 mg/dL (ref 0.2–1.2)
Total Protein: 6.9 g/dL (ref 6.0–8.3)

## 2021-12-01 LAB — LIPASE: Lipase: 26 U/L (ref 11.0–59.0)

## 2021-12-31 ENCOUNTER — Ambulatory Visit: Payer: BC Managed Care – PPO | Admitting: Family Medicine

## 2022-01-04 ENCOUNTER — Encounter: Payer: Self-pay | Admitting: Family Medicine

## 2022-01-04 ENCOUNTER — Ambulatory Visit: Payer: BC Managed Care – PPO | Admitting: Family Medicine

## 2022-01-04 VITALS — BP 112/74 | HR 71 | Temp 97.7°F | Ht 66.0 in | Wt 156.2 lb

## 2022-01-04 DIAGNOSIS — R1013 Epigastric pain: Secondary | ICD-10-CM | POA: Diagnosis not present

## 2022-01-04 MED ORDER — OMEPRAZOLE 20 MG PO CPDR: 20.0000 mg | DELAYED_RELEASE_CAPSULE | Freq: Two times a day (BID) | ORAL | 1 refills | Status: DC

## 2022-01-04 MED ORDER — FAMOTIDINE 20 MG PO TABS: 20.0000 mg | ORAL_TABLET | Freq: Two times a day (BID) | ORAL | 3 refills | Status: DC

## 2022-01-04 NOTE — Progress Notes (Signed)
Subjective:    Patient ID: Seth Bates, male    DOB: 10-Jun-1980, 41 y.o.   MRN: [id number]  HPI Pt presents for f/u of epigastric pain /gastritis   Wt Readings from Last 3 Encounters:  01/04/22 156 lb 4 oz (70.9 kg)  11/30/21 154 lb (69.9 kg)  07/13/21 158 lb (71.7 kg)   25.22 kg/m  Last visit we started back omeprazole 20 mg daily   Chest pain is better  Still nauseated - a little less   Still some stomach pain  Dull ache  Eating does not make a diffence  No particular time of day   No nsaids    Labs were reassuring   Office Visit on 11/30/2021  Component Date Value Ref Range Status   WBC 11/30/2021 9.3  4.0 - 10.5 K/uL Final   RBC 11/30/2021 5.28  4.22 - 5.81 Mil/uL Final   Hemoglobin 11/30/2021 16.0  13.0 - 17.0 g/dL Final   HCT 11/30/2021 47.2  39.0 - 52.0 % Final   MCV 11/30/2021 89.6  78.0 - 100.0 fl Final   MCHC 11/30/2021 33.9  30.0 - 36.0 g/dL Final   RDW 11/30/2021 13.5  11.5 - 15.5 % Final   Platelets 11/30/2021 363.0  150.0 - 400.0 K/uL Final   Neutrophils Relative % 11/30/2021 51.3  43.0 - 77.0 % Final   Lymphocytes Relative 11/30/2021 32.3  12.0 - 46.0 % Final   Monocytes Relative 11/30/2021 9.9  3.0 - 12.0 % Final   Eosinophils Relative 11/30/2021 5.4 (H)  0.0 - 5.0 % Final   Basophils Relative 11/30/2021 1.1  0.0 - 3.0 % Final   Neutro Abs 11/30/2021 4.8  1.4 - 7.7 K/uL Final   Lymphs Abs 11/30/2021 3.0  0.7 - 4.0 K/uL Final   Monocytes Absolute 11/30/2021 0.9  0.1 - 1.0 K/uL Final   Eosinophils Absolute 11/30/2021 0.5  0.0 - 0.7 K/uL Final   Basophils Absolute 11/30/2021 0.1  0.0 - 0.1 K/uL Final   Sodium 11/30/2021 137  135 - 145 mEq/L Final   Potassium 11/30/2021 4.2  3.5 - 5.1 mEq/L Final   Chloride 11/30/2021 101  96 - 112 mEq/L Final   CO2 11/30/2021 28  19 - 32 mEq/L Final   Glucose, Bld 11/30/2021 83  70 - 99 mg/dL Final   BUN 11/30/2021 9  6 - 23 mg/dL Final   Creatinine, Ser 11/30/2021 1.00  0.40 - 1.50 mg/dL Final   GFR  11/30/2021 93.40  >60.00 mL/min Final   Calculated using the CKD-EPI Creatinine Equation (2021)   Calcium 11/30/2021 9.2  8.4 - 10.5 mg/dL Final   Total Bilirubin 11/30/2021 0.7  0.2 - 1.2 mg/dL Final   Bilirubin, Direct 11/30/2021 0.1  0.0 - 0.3 mg/dL Final   Alkaline Phosphatase 11/30/2021 66  39 - 117 U/L Final   AST 11/30/2021 25  0 - 37 U/L Final   ALT 11/30/2021 50  0 - 53 U/L Final   Total Protein 11/30/2021 6.9  6.0 - 8.3 g/dL Final   Albumin 11/30/2021 4.5  3.5 - 5.2 g/dL Final   Lipase 11/30/2021 26.0  11.0 - 59.0 U/L Final    Patient Active Problem List   Diagnosis Date Noted   Epigastric pain 11/30/2021   IBS (irritable bowel syndrome) 11/30/2021   Motion sickness 07/13/2021   Protrusion of lumbar intervertebral disc 01/27/2021   Acid reflux 09/24/2020   Other intervertebral disc degeneration, lumbar region 03/25/2020   Hyperlipidemia 03/19/2020  Elevated ALT measurement 03/19/2020   Hypertension 03/05/2020   Chronic headache 03/05/2020   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 No pertinent surgical history. Social History   Tobacco Use   Smoking status: Former    Packs/day: 0.25    Types: Cigarettes    Quit date: 11/01/2016    Years since quitting: 5.1   Smokeless tobacco: Never  Vaping Use   Vaping Use: Never used  Substance Use Topics   Alcohol use: No    Alcohol/week: 0.0 standard drinks of alcohol   Drug use: No   History reviewed. No pertinent family history. Allergies  Allergen Reactions   Robinul [Glycopyrrolate]     Dizzy, abdominal distention   Current Outpatient Medications on File Prior to Visit  Medication Sig Dispense Refill   cyclobenzaprine (FLEXERIL) 10 MG tablet Take 1 tablet (10 mg total) by mouth at bedtime as needed for muscle spasms. 30  tablet 1   dicyclomine (BENTYL) 10 MG capsule Take 1 capsule (10 mg total) by mouth 3 (three) times daily as needed for spasms. 90 capsule 11   meclizine (ANTIVERT) 25 MG tablet Take 1 tablet (25 mg total) by mouth 3 (three) times daily as needed for dizziness. Caution of sedation 20 tablet 0   metoprolol succinate (TOPROL-XL) 50 MG 24 hr tablet TAKE 1 TABLET BY MOUTH DAILY. TAKE WITH OR IMMEDIATELY FOLLOWING A MEAL. NEEDS OFFICE VISIT 90 tablet 3   scopolamine (TRANSDERM-SCOP) 1 MG/3DAYS Place 1 patch (1.5 mg total) onto the skin every 3 (three) days. 4 patch 1   triamcinolone (NASACORT) 55 MCG/ACT AERO nasal inhaler Place 2 sprays into the nose daily. 1 each 10   No current facility-administered medications on file prior to visit.    Review of Systems  Constitutional:  Negative for activity change, appetite change, fatigue, fever and unexpected weight change.  HENT:  Negative for congestion, rhinorrhea, sore throat and trouble swallowing.   Eyes:  Negative for pain, redness, itching and visual disturbance.  Respiratory:  Negative for cough, chest tightness, shortness of breath and wheezing.   Cardiovascular:  Negative for chest pain and palpitations.  Gastrointestinal:  Positive for abdominal pain and nausea. Negative for abdominal distention, anal bleeding, blood in stool, constipation, diarrhea, rectal pain and vomiting.  Endocrine: Negative for cold intolerance, heat intolerance, polydipsia and polyuria.  Genitourinary:  Negative for difficulty urinating, dysuria, frequency and urgency.  Musculoskeletal:  Negative for arthralgias, joint swelling and myalgias.  Skin:  Negative for pallor and rash.  Neurological:  Negative for dizziness, tremors, weakness, numbness and headaches.  Hematological:  Negative for adenopathy. Does not bruise/bleed easily.  Psychiatric/Behavioral:  Negative for decreased concentration and dysphoric mood. The patient is not nervous/anxious.        Objective:    Physical Exam Constitutional:      General: He is not in acute distress.    Appearance: Normal appearance. He is well-developed. He is not ill-appearing or diaphoretic.  HENT:     Head: Normocephalic and atraumatic.  Eyes:     General: No scleral icterus.    Conjunctiva/sclera: Conjunctivae normal.     Pupils: Pupils are equal, round, and reactive to light.  Cardiovascular:     Rate and Rhythm: Normal rate and regular rhythm.     Heart sounds: Normal  heart sounds.  Pulmonary:     Effort: Pulmonary effort is normal. No respiratory distress.     Breath sounds: Normal breath sounds. No wheezing or rales.  Abdominal:     General: Abdomen is flat. Bowel sounds are normal. There is no distension.     Palpations: Abdomen is soft. There is no fluid wave, hepatomegaly, splenomegaly, mass or pulsatile mass.     Tenderness: There is no abdominal tenderness. There is no guarding or rebound. Negative signs include Murphy's sign.     Hernia: No hernia is present.     Comments: No tenderness today   No murphy sign   Musculoskeletal:     Cervical back: Normal range of motion and neck supple.  Lymphadenopathy:     Cervical: No cervical adenopathy.  Skin:    General: Skin is warm and dry.     Coloration: Skin is not pale.     Findings: No erythema.  Neurological:     Mental Status: He is alert.           Assessment & Plan:   Problem List Items Addressed This Visit       Other   Epigastric pain - Primary    Pt had some improvement but still suffering  Still ? If possible gastritis  Labs reviewed and reassuring from last time No more pain in chest  Keeps a dull epigastric pain (today no tenderness however) Disc diet -he even quit cokes  No known trigger Plan to inc omeprazole to 20 mg bid  (am dose before eating) Add pepcid 20 mg bid also   Inst him to call and update in 1-2 wk If not improved would consider [place] abd and also GI referral   Inst to call in interim also if  symptoms worsen or if side eff

## 2022-01-04 NOTE — Assessment & Plan Note (Addendum)
Pt had some improvement but still suffering  Still ? If possible gastritis  Labs reviewed and reassuring from last time No more pain in chest  Keeps a dull epigastric pain (today no tenderness however) Disc diet -he even quit cokes  No known trigger Plan to inc omeprazole to 20 mg bid  (am dose before eating) Add pepcid 20 mg bid also   Inst him to call and update in 1-2 wk If not improved would consider [place] abd and also GI referral   Inst to call in interim also if symptoms worsen or if side eff

## 2022-01-04 NOTE — Patient Instructions (Addendum)
Take the morning  omeprazole at least 30 minutes before eating   Then take pepcid am with breakfast and bedime   Take another omeprazole in the evening   Update [place] in 1-2 weeks If not improving significantly I will order an ultrasound of the abdomen and consider  GI referral   Continue to watch diet and avoid nsaids

## 2022-02-16 ENCOUNTER — Ambulatory Visit: Payer: BC Managed Care – PPO | Admitting: Family Medicine

## 2022-02-16 ENCOUNTER — Encounter: Payer: Self-pay | Admitting: Family Medicine

## 2022-02-16 VITALS — BP 112/76 | HR 78 | Temp 98.0°F | Ht 66.0 in | Wt 157.1 lb

## 2022-02-16 DIAGNOSIS — U071 COVID-19: Secondary | ICD-10-CM | POA: Diagnosis not present

## 2022-02-16 DIAGNOSIS — J069 Acute upper respiratory infection, unspecified: Secondary | ICD-10-CM

## 2022-02-16 DIAGNOSIS — H8113 Benign paroxysmal vertigo, bilateral: Secondary | ICD-10-CM | POA: Diagnosis not present

## 2022-02-16 DIAGNOSIS — H8110 Benign paroxysmal vertigo, unspecified ear: Secondary | ICD-10-CM | POA: Insufficient documentation

## 2022-02-16 LAB — POC COVID19 BINAXNOW: SARS Coronavirus 2 Ag: POSITIVE — AB

## 2022-02-16 NOTE — Progress Notes (Signed)
Subjective:    Patient ID: Seth Bates, male    DOB: 12/28/1980, 42 y.o.   MRN: [id number]  HPI Pt presents with vertigo symptoms   Wt Readings from Last 3 Encounters:  02/16/22 157 lb 2 oz (71.3 kg)  01/04/22 156 lb 4 oz (70.9 kg)  11/30/21 154 lb (69.9 kg)   25.36 kg/m  Vitals:   02/16/22 1355  BP: 112/76  Pulse: 78  Temp: 98 F (36.7 C)  SpO2: 97%    Symptoms on/off for a few weeks   Worse Friday and had to leave work early   Dizziness is worse with position change  Feels like room is spinning  Worse with head rotation or standing   Some nausea , not vomiting  Felt hot on Friday (not fever)   Appetite is ok   Then developed nasal congestion  Sinus pain and pressure  Mucous is clear to green/brown No fever Ears feel full  A little pain in the left  No headache - is all in his face  Throat was sore, better now   Some cough - no sob or wheezing  Not productive   Otc: Day quil Ny quil No ibuprofen   Took the meclizine  - from the fall   Vertigo is so so today but he has not done much   Did not do a covid test  No specific sick contacts that he knows of   Results for orders placed or performed in visit on 02/16/22  POC COVID-19 BinaxNow  Result Value Ref Range   SARS Coronavirus 2 Ag Positive (A) Negative     Patient Active Problem List   Diagnosis Date Noted   Vertigo, benign positional 02/16/2022   Epigastric pain 11/30/2021   IBS (irritable bowel syndrome) 11/30/2021   Motion sickness 07/13/2021   Protrusion of lumbar intervertebral disc 01/27/2021   Acid reflux 09/24/2020   Other intervertebral disc degeneration, lumbar region 03/25/2020   Hyperlipidemia 03/19/2020   Elevated ALT measurement 03/19/2020   Hypertension 03/05/2020   Chronic headache 03/05/2020   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 No pertinent surgical history. Social History   Tobacco Use   Smoking status: Former    Packs/day: 0.25    Types: Cigarettes    Quit date: 11/01/2016    Years since quitting: 5.2   Smokeless tobacco: Never  Vaping Use   Vaping Use: Never used  Substance Use Topics   Alcohol use: No    Alcohol/week: 0.0 standard drinks of alcohol   Drug use: No   History reviewed. No pertinent family history. Allergies  Allergen Reactions   Robinul [Glycopyrrolate]     Dizzy, abdominal distention   Current Outpatient Medications on File Prior to Visit  Medication Sig Dispense Refill   dicyclomine (BENTYL) 10 MG capsule Take 1 capsule (10 mg total) by mouth 3 (three) times daily as needed for spasms. 90 capsule 11   famotidine (PEPCID) 20 MG tablet Take 1 tablet (20 mg total) by mouth 2 (two) times daily. 60 tablet 3   meclizine (ANTIVERT) 25 MG tablet Take 1 tablet (25 mg total) by mouth 3 (three) times daily as needed for dizziness. Caution of sedation 20 tablet 0   metoprolol succinate (  TOPROL-XL) 50 MG 24 hr tablet TAKE 1 TABLET BY MOUTH DAILY. TAKE WITH OR IMMEDIATELY FOLLOWING A MEAL. NEEDS OFFICE VISIT 90 tablet 3   omeprazole (PRILOSEC) 20 MG capsule Take 1 capsule (20 mg total) by mouth 2 (two) times daily before a meal. 180 capsule 1   triamcinolone (NASACORT) 55 MCG/ACT AERO nasal inhaler Place 2 sprays into the nose daily. 1 each 10   No current facility-administered medications on file prior to visit.      Review of Systems  Constitutional:  Positive for appetite change and fatigue. Negative for fever.  HENT:  Positive for congestion, postnasal drip, rhinorrhea, sinus pressure, sneezing and sore throat. Negative for ear pain and voice change.   Eyes:  Negative for pain and discharge.  Respiratory:  Positive for cough. Negative for shortness of breath, wheezing and stridor.    Cardiovascular:  Negative for chest pain.  Gastrointestinal:  Negative for diarrhea and vomiting.  Genitourinary:  Negative for frequency, hematuria and urgency.  Musculoskeletal:  Negative for arthralgias and myalgias.  Skin:  Negative for rash.  Neurological:  Positive for dizziness. Negative for tremors, seizures, syncope, facial asymmetry, speech difficulty, weakness, light-headedness and numbness.  Psychiatric/Behavioral:  Negative for confusion and dysphoric mood.        Objective:   Physical Exam Constitutional:      General: He is not in acute distress.    Appearance: Normal appearance. He is well-developed and normal weight. He is not ill-appearing, toxic-appearing or diaphoretic.  HENT:     Head: Normocephalic and atraumatic.     Comments: Nares are injected and congested    No facial or sinus tenderness     Right Ear: Tympanic membrane, ear canal and external ear normal.     Left Ear: Tympanic membrane, ear canal and external ear normal.     Nose: Congestion and rhinorrhea present.     Mouth/Throat:     Mouth: Mucous membranes are moist.     Pharynx: Oropharynx is clear. No oropharyngeal exudate or posterior oropharyngeal erythema.     Comments: Clear pnd  Eyes:     General: No scleral icterus.       Right eye: No discharge.        Left eye: No discharge.     Conjunctiva/sclera: Conjunctivae normal.     Pupils: Pupils are equal, round, and reactive to light.     Comments: 1-2 beats of horizontal nystabmus bilaterally  Cardiovascular:     Rate and Rhythm: Normal rate.     Heart sounds: Normal heart sounds.  Pulmonary:     Effort: Pulmonary effort is normal. No respiratory distress.     Breath sounds: Normal breath sounds. No stridor. No wheezing, rhonchi or rales.  Chest:     Chest wall: No tenderness.  Musculoskeletal:     Cervical back: Normal range of motion and neck supple.  Lymphadenopathy:     Cervical: No cervical adenopathy.  Skin:    General: Skin  is warm and dry.     Capillary Refill: Capillary refill takes less than 2 seconds.     Coloration: Skin is not pale.     Findings: No rash.  Neurological:     Mental Status: He is alert.     Cranial Nerves: Cranial nerves 2-12 are intact. No cranial nerve deficit, dysarthria or facial asymmetry.     Sensory: No sensory deficit.     Motor: No weakness, abnormal muscle tone or pronator drift.  Coordination: Romberg sign negative. Coordination normal. Finger-Nose-Finger Test normal.     Gait: Gait normal.     Deep Tendon Reflexes: Reflexes normal.  Psychiatric:        Mood and Affect: Mood normal.           Assessment & Plan:   Problem List Items Addressed This Visit       Nervous and Auditory   Vertigo, benign positional    Recurrent in the past  Worse since Friday- also has viral uri symptoms and congestion , today tested pos for covid 19  Both virus and congestion could cause flare in symptoms  Today some improvement  Has meclizine 25 mg to use prn (does sedate) Does not tolerate nasal sprays well (they cause a headache)   Disc prednisone for congestion , he would rather avoid due to past side eff May try flonase again and see if tolerated Disc symptom care Enc him to change position slowly (esp after being still) to avoid falls  Disc fluids/rest Disc ER precautions  Update if not starting to improve in a week or if worsening          Other   COVID-19 - Primary    With uri symptoms and vertigo (he is prone to vertigo) Mild symptoms Reassuring exam  H/o HTN that is controlled   Discussed symptom care  Fluids/rest  Mucinex DM prn  Meclizine for vertigo  ER precautions noted Reviewed isolation and masking protocol Update if not starting to improve in a week or if worsening   Handouts given

## 2022-02-16 NOTE — Assessment & Plan Note (Signed)
With uri symptoms and vertigo (he is prone to vertigo) Mild symptoms Reassuring exam  H/o HTN that is controlled   Discussed symptom care  Fluids/rest  Mucinex DM prn  Meclizine for vertigo  ER precautions noted Reviewed isolation and masking protocol Update if not starting to improve in a week or if worsening   Handouts given

## 2022-02-16 NOTE — Patient Instructions (Signed)
Drink fluids and rest  Take meclizine for dizziness as needed   If your congestion or vertigo worsen let us know and we can try prednisone   If you tolerate saline nasal spray go ahead and try it for congestion   If you want to try flonase - try it and if it makes your head hurt then stop it   Treat your symptoms  Mucinex DM is good for cough and congestion  Watch for wheezing or trouble breathing  If sever - go to the ER   Update if not starting to improve in a week or if worsening    You need to isolate yourself for mimimum of 5 days or until your symptoms are better  Then when you return to public- wear a mask for an additional 10 days

## 2022-02-16 NOTE — Assessment & Plan Note (Addendum)
Recurrent in the past  Worse since Friday- also has viral uri symptoms and congestion , today tested pos for covid 19  Both virus and congestion could cause flare in symptoms  Today some improvement  Has meclizine 25 mg to use prn (does sedate) Does not tolerate nasal sprays well (they cause a headache)   Disc prednisone for congestion , he would rather avoid due to past side eff May try flonase again and see if tolerated Disc symptom care Enc him to change position slowly (esp after being still) to avoid falls  Disc fluids/rest Disc ER precautions  Update if not starting to improve in a week or if worsening

## 2022-03-09 ENCOUNTER — Telehealth: Payer: Self-pay | Admitting: Family Medicine

## 2022-03-09 MED ORDER — PREDNISONE 10 MG PO TABS: ORAL_TABLET | ORAL | 0 refills | Status: DC

## 2022-03-09 NOTE — Telephone Encounter (Signed)
Patient was seen on 02/16/22 for dizziness. He called in today stating that the dizziness went away for a week,but its back now. He discussed being prescribed some medication but none was prescribed to him. He would like to know if a rx can be called in for him now?

## 2022-03-09 NOTE — Telephone Encounter (Signed)
He noted at his visit that he had some meclizine at home to use as needed. Is he out of it?  That is what I will send if he is out Thanks

## 2022-03-09 NOTE — Telephone Encounter (Signed)
Pt said he does have meclizine at home and has been taking it with not much improvement. Pt said he was asking for a round of prednisone as him and PCP discussed at appt. Pt said if PCP doesn't think he needs prednisone then he will do whatever she recommends but the meclizine isn't helping much.  [facility]

## 2022-03-09 NOTE — Telephone Encounter (Signed)
I sent in low dose prednisone  Hopefully this will help the congestion causing dizziness  Please follow up if not improving

## 2022-03-09 NOTE — Telephone Encounter (Signed)
Pt notified Rx sent to pharmacy and advised of Dr. Marliss Coots comments

## 2022-03-15 ENCOUNTER — Encounter: Payer: Self-pay | Admitting: Family Medicine

## 2022-03-15 ENCOUNTER — Ambulatory Visit: Payer: BC Managed Care – PPO | Admitting: Family Medicine

## 2022-03-15 VITALS — BP 136/80 | HR 79 | Temp 97.9°F | Ht 66.0 in | Wt 154.4 lb

## 2022-03-15 DIAGNOSIS — H8113 Benign paroxysmal vertigo, bilateral: Secondary | ICD-10-CM | POA: Diagnosis not present

## 2022-03-15 DIAGNOSIS — R42 Dizziness and giddiness: Secondary | ICD-10-CM

## 2022-03-15 MED ORDER — MECLIZINE HCL 25 MG PO TABS: 25.0000 mg | ORAL_TABLET | Freq: Three times a day (TID) | ORAL | 0 refills | Status: AC | PRN

## 2022-03-15 NOTE — Progress Notes (Signed)
Subjective:    Patient ID: Seth Bates, male    DOB: September 02, 1980, 42 y.o.   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  Vomited / room spinning and throwing up  Lay on bathroom floor all day   Some sinus pressure  Ear pressure  Prednisone did not really help   Meclizine-- took it yesterday but did not stay down  Does not work well for him   Today is fair  When he lies down and then change position or life head is bad Also when bending over   Little sinus headache today  No more than thank   He did drink fluids today  HTN bp is stable today  No cp or palpitations or headaches or edema  No side effects to medicines  BP Readings from Last 3 Encounters:  03/15/22 136/80  02/16/22 112/76  01/04/22 112/74    Metoprolol xl 50 mg daily  Pulse Rate: 79   Patient Active Problem List   Diagnosis Date Noted   Vertigo, benign positional 02/16/2022   Epigastric pain 11/30/2021   IBS (irritable bowel syndrome) 11/30/2021   Motion sickness 07/13/2021   Protrusion of lumbar intervertebral disc 01/27/2021   Acid reflux 09/24/2020   Other intervertebral disc degeneration, lumbar region 03/25/2020   Hyperlipidemia 03/19/2020   Elevated ALT measurement 03/19/2020   Hypertension 03/05/2020   Chronic headache 03/05/2020   Chronic bilateral low back pain with bilateral sciatica 03/04/2020   Vertigo 02/07/2012   Allergic rhinitis    COVID-19 02/05/2011   SHOULDER JOINT INSTABILITY 08/29/2008   Past Medical  History:  Diagnosis Date   Allergic rhinitis    History reviewed. No pertinent surgical history. Social History   Tobacco Use   Smoking status: Former    Packs/day: 0.25    Types: Cigarettes    Quit date: 11/01/2016    Years since quitting: 5.3   Smokeless tobacco: Never  Vaping Use   Vaping Use: Never used  Substance Use Topics   Alcohol use: No    Alcohol/week: 0.0 standard drinks of alcohol   Drug use: No   History reviewed. No pertinent family history. Allergies  Allergen Reactions   Robinul [Glycopyrrolate]     Dizzy, abdominal distention   Current Outpatient Medications on File Prior to Visit  Medication Sig Dispense Refill   dicyclomine (BENTYL) 10 MG capsule Take 1 capsule (10 mg total) by mouth 3 (three) times daily as needed for spasms. 90 capsule 11   famotidine (PEPCID) 20 MG tablet Take 1 tablet (20 mg total) by mouth 2 (two) times daily. 60 tablet 3   metoprolol succinate (TOPROL-XL) 50 MG 24 hr tablet TAKE 1 TABLET BY MOUTH DAILY. TAKE WITH OR IMMEDIATELY FOLLOWING A MEAL. NEEDS OFFICE VISIT 90 tablet 3   omeprazole (PRILOSEC) 20 MG capsule Take 1 capsule (20 mg total) by mouth 2 (two) times daily before a meal. 180 capsule  1   triamcinolone (NASACORT) 55 MCG/ACT AERO nasal inhaler Place 2 sprays into the nose daily. 1 each 10   No current facility-administered medications on file prior to visit.    Review of Systems  Constitutional:  Negative for activity change, appetite change, fatigue, fever and unexpected weight change.  HENT:  Positive for sinus pressure. Negative for congestion, rhinorrhea, sinus pain, sore throat and trouble swallowing.   Eyes:  Negative for pain, redness, itching and visual disturbance.  Respiratory:  Negative for cough, chest tightness, shortness of breath and wheezing.   Cardiovascular:  Negative for chest pain and palpitations.  Gastrointestinal:  Negative for abdominal pain, blood in stool, constipation, diarrhea and nausea.   Endocrine: Negative for cold intolerance, heat intolerance, polydipsia and polyuria.  Genitourinary:  Negative for difficulty urinating, dysuria, frequency and urgency.  Musculoskeletal:  Negative for arthralgias, joint swelling and myalgias.  Skin:  Negative for pallor and rash.  Neurological:  Positive for dizziness. Negative for tremors, seizures, syncope, facial asymmetry, speech difficulty, weakness, numbness and headaches.  Hematological:  Negative for adenopathy. Does not bruise/bleed easily.  Psychiatric/Behavioral:  Negative for decreased concentration and dysphoric mood. The patient is not nervous/anxious.        Objective:   Physical Exam Constitutional:      General: He is not in acute distress.    Appearance: He is well-developed.  HENT:     Head: Normocephalic and atraumatic.     Comments: No facial swelling or tenderness  No sinus tenderness    Right Ear: Tympanic membrane, ear canal and external ear normal. There is no impacted cerumen.     Left Ear: Tympanic membrane, ear canal and external ear normal. There is no impacted cerumen.     Nose: Nose normal. No congestion.     Comments: Boggy nares     Mouth/Throat:     Pharynx: No oropharyngeal exudate.  Eyes:     General: No scleral icterus.       Right eye: No discharge.        Left eye: No discharge.     Conjunctiva/sclera: Conjunctivae normal.     Pupils: Pupils are equal, round, and reactive to light.     Comments: 2-3 beats of horiz nystagmus to L and 2 beats to R   Neck:     Thyroid: No thyromegaly.     Vascular: No carotid bruit or JVD.     Trachea: No tracheal deviation.  Cardiovascular:     Rate and Rhythm: Normal rate and regular rhythm.     Heart sounds: Normal heart sounds. No murmur heard. Pulmonary:     Effort: Pulmonary effort is normal. No respiratory distress.     Breath sounds: Normal breath sounds. No wheezing or rales.  Abdominal:     General: Bowel sounds are normal. There is no  distension.     Palpations: Abdomen is soft. There is no mass.     Tenderness: There is no abdominal tenderness.  Musculoskeletal:        General: No tenderness.     Cervical back: Full passive range of motion without pain, normal range of motion and neck supple.  Lymphadenopathy:     Cervical: No cervical adenopathy.  Skin:    General: Skin is warm and dry.     Coloration: Skin is not pale.     Findings: No rash.  Neurological:     Mental Status: He is alert and oriented to person, place, and time.  Cranial Nerves: Cranial nerves 2-12 are intact. No cranial nerve deficit, dysarthria or facial asymmetry.     Sensory: No sensory deficit.     Motor: Motor function is intact. No weakness, tremor, atrophy, abnormal muscle tone, seizure activity or pronator drift.     Coordination: Coordination is intact. Romberg sign negative. Coordination normal. Finger-Nose-Finger Test normal.     Gait: Gait is intact. Gait and tandem walk normal.     Deep Tendon Reflexes: Reflexes are normal and symmetric. Reflexes normal.     Comments: No focal cerebellar signs   Psychiatric:        Behavior: Behavior normal.        Thought Content: Thought content normal.           Assessment & Plan:   Problem List Items Addressed This Visit       Nervous and Auditory   Vertigo, benign positional - Primary    Pt prone to vertigo in the past , now worse since having covid 19  Very bad bout last night/ improved today Reassuring exam/ no neuro changes  Some sinus and ear congestion, occ headache but not new  Meclizine is not helping much  Discussed ER precautions and need for hydration   Given timeline will refer to ENT for further eval -pending appt notification Inst pt to change positions slowly if possible (esp after lying down) Inst pt to call us if symptoms worsen again in the meantime         Relevant Orders   Ambulatory referral to ENT     Other   Vertigo

## 2022-03-15 NOTE — Assessment & Plan Note (Signed)
Pt prone to vertigo in the past , now worse since having covid 19  Very bad bout last night/ improved today Reassuring exam/ no neuro changes  Some sinus and ear congestion, occ headache but not new  Meclizine is not helping much  Discussed ER precautions and need for hydration   Given timeline will refer to ENT for further eval -pending appt notification Inst pt to change positions slowly if possible (esp after lying down) Inst pt to call us if symptoms worsen again in the meantime

## 2022-03-15 NOTE — Patient Instructions (Signed)
Change position slowly especially if lying down   Take meclizine if needed (it does sedate)  I placed a referral to ENT (ear specialist) If you don't get a call in 1-2 days let us know   If symptoms get severe again -go to ER or call 911 if needed

## 2022-03-16 ENCOUNTER — Encounter: Payer: Self-pay | Admitting: *Deleted

## 2022-03-19 ENCOUNTER — Telehealth: Payer: Self-pay | Admitting: Family Medicine

## 2022-03-19 NOTE — Telephone Encounter (Signed)
Patient called in to check on the status of his referral to Otolaryngology   He was advised to call in if he hast heard anything in 2 days.

## 2022-03-22 NOTE — Telephone Encounter (Signed)
Pt was sent a mychart letter on 03/16/22 with contact information. I sent a mychart message and letter again today to provide the office information.  I have also emailed referral coordinator to see where this stands. She will reach out to pt to schedule once the referral is received and reviewed.  ELEA

## 2022-04-19 IMAGING — MR MR LUMBAR SPINE W/O CM
5 of 6 series · 29 of 48 positions shown · non-contrast
Comparison: Lumbar spine radiographs 01/29/2020

CLINICAL DATA: Chronic low back pain radiating to the right
buttocks and legs.

EXAM:
MRI LUMBAR SPINE WITHOUT CONTRAST
TECHNIQUE: Multiplanar, multisequence MR imaging of the lumbar spine was
performed. No intravenous contrast was administered.

[Series 3: T2 · sagittal · 4.0mm · 1.09mm/px · 5 of 16 slices shown (1 of 2)]
[im 1/16]
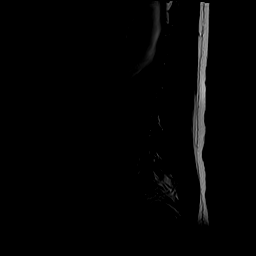
[im 4/16]
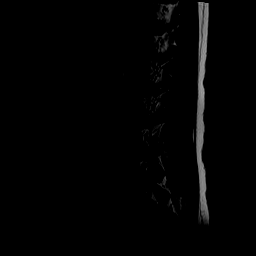
[im 8/16]
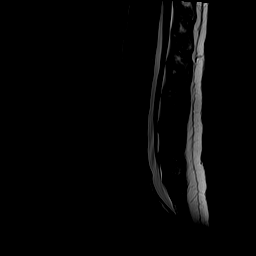
[im 12/16]
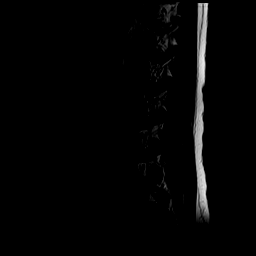
[im 16/16]
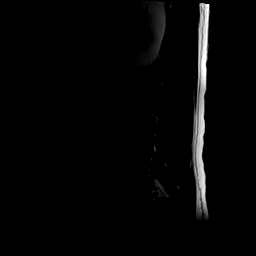

[Series 5: T1 · sagittal · 4.0mm · 1.09mm/px · 5 of 16 slices shown (1 of 3)]
[im 1/16]
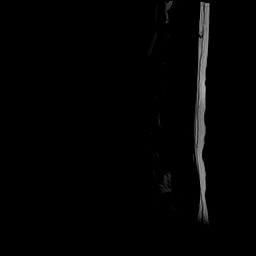
[im 4/16]
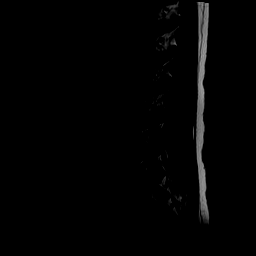
[im 8/16]
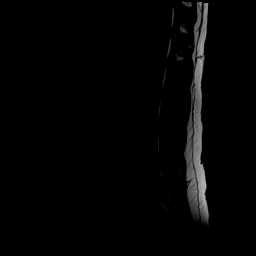
[im 12/16]
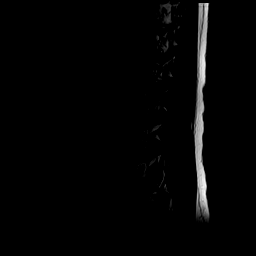
[im 16/16]
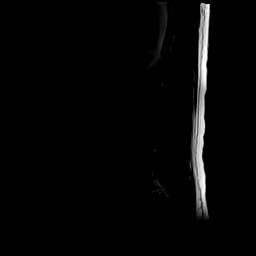

[Series 6: T1 · sagittal · 4.0mm · 1.09mm/px · 5 of 16 slices shown (2 of 3)]
[im 1/16]
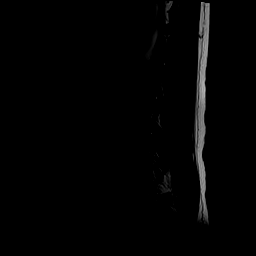
[im 4/16]
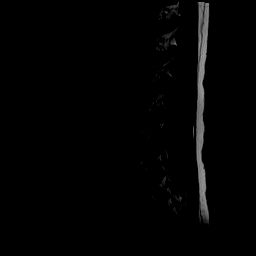
[im 8/16]
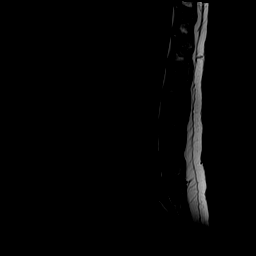
[im 12/16]
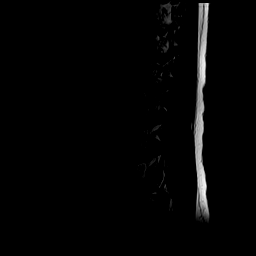
[im 16/16]
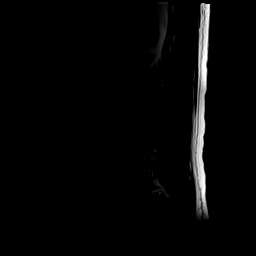

[Series 7: T2 · axial · 4.0mm · 0.39mm/px · z∈[-118,+93]mm · 8 of 42 slices shown (2 of 2)]
[im 1/42]
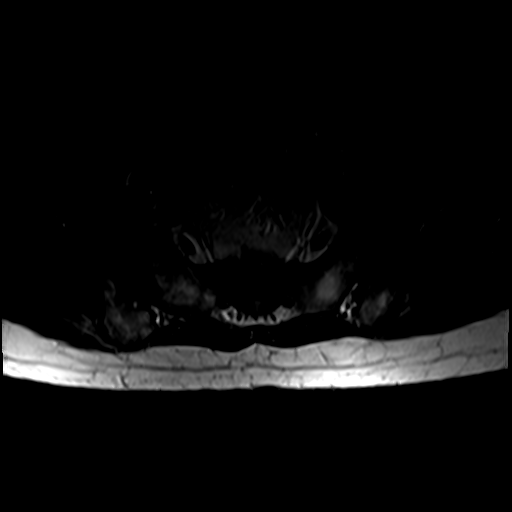
[im 7/42]
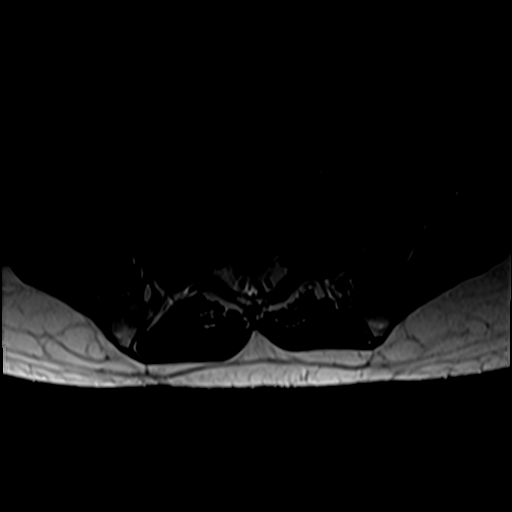
[im 13/42]
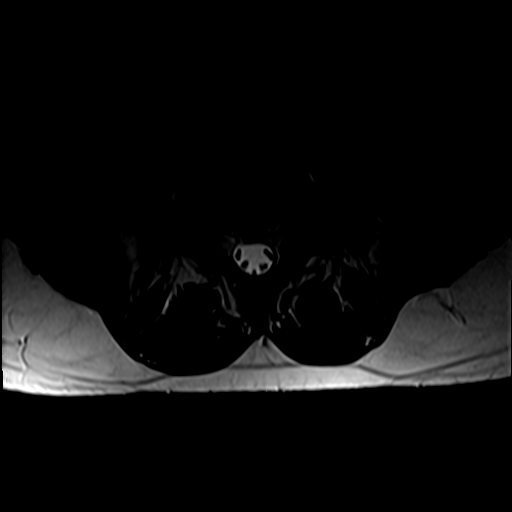
[im 19/42]
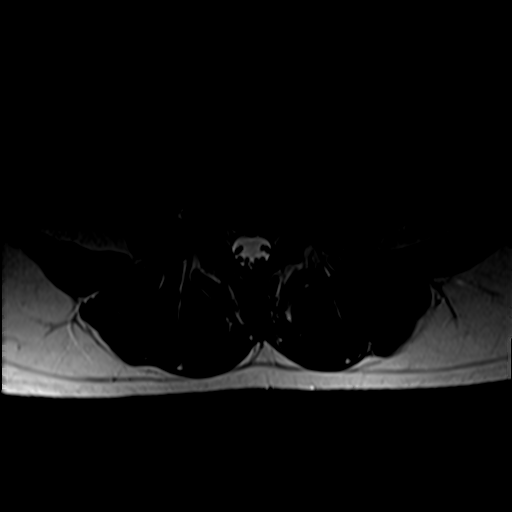
[im 23/42]
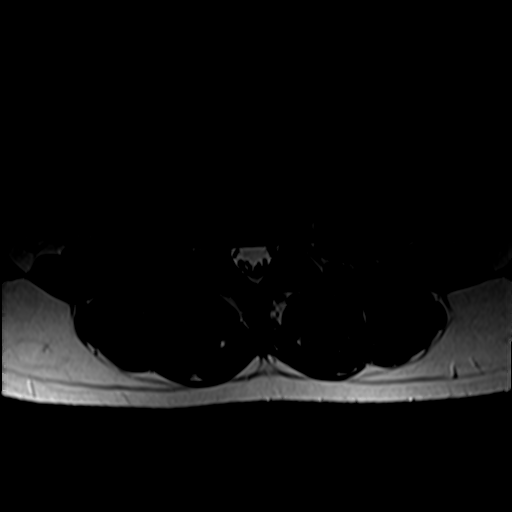
[im 29/42]
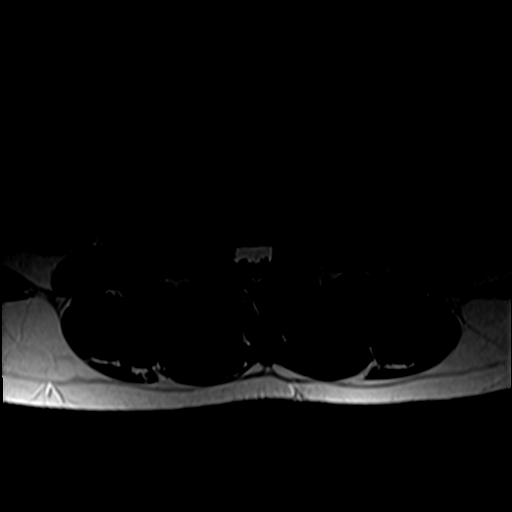
[im 35/42]
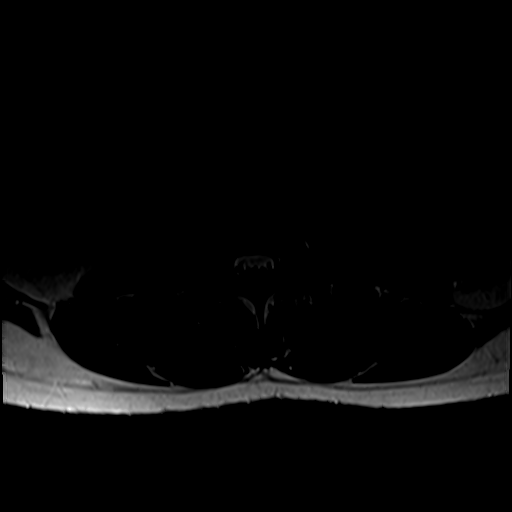
[im 42/42]
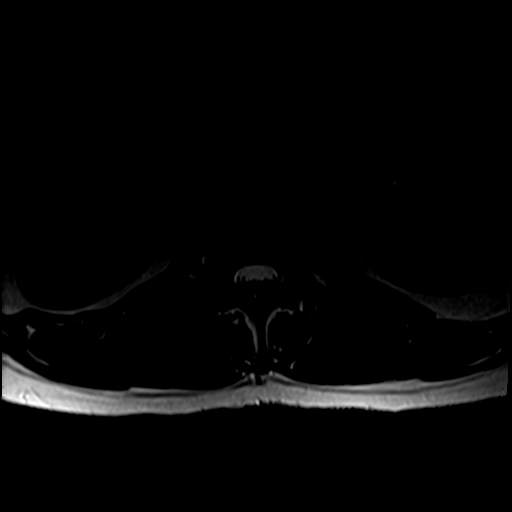

[Series 8: T1 · axial · 4.0mm · 0.39mm/px · z∈[-118,+30]mm · 6 of 42 slices shown (3 of 3)]
[im 1/42]
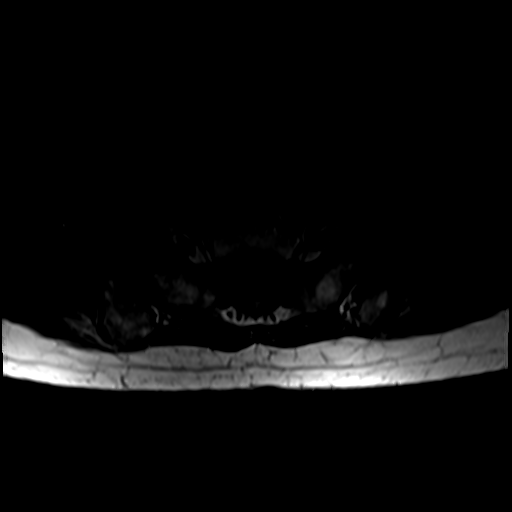
[im 7/42]
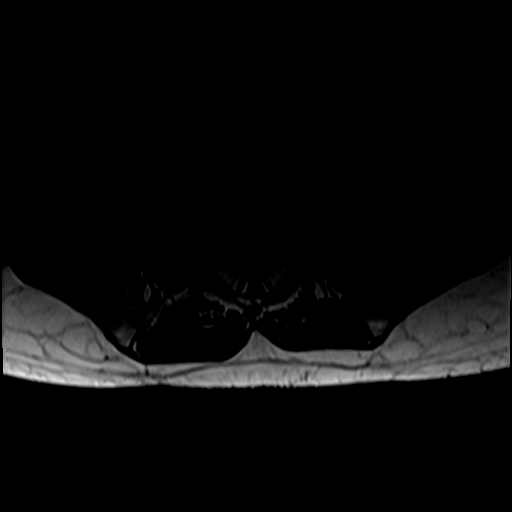
[im 13/42]
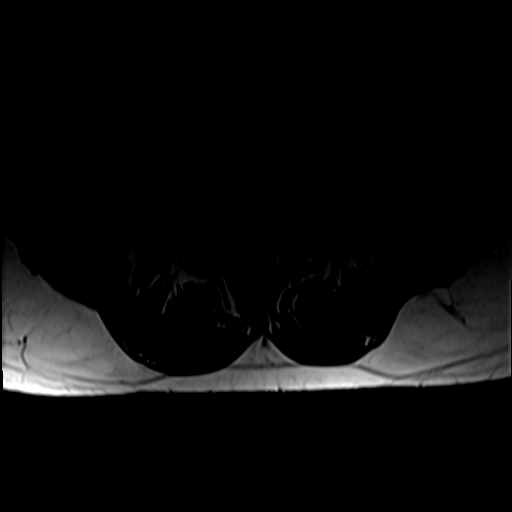
[im 19/42]
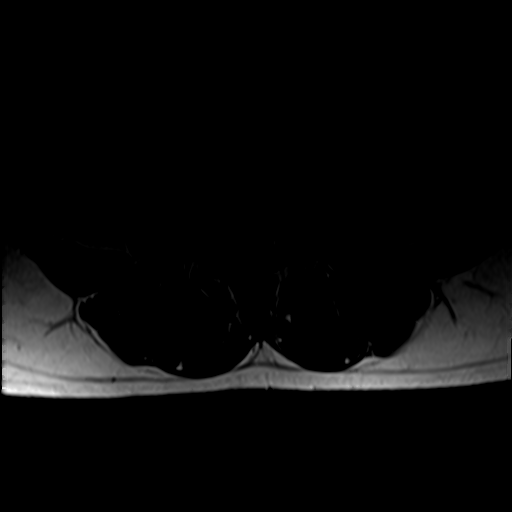
[im 23/42]
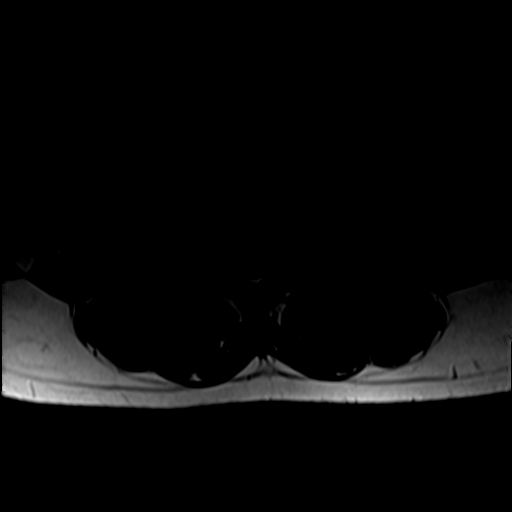
[im 29/42]
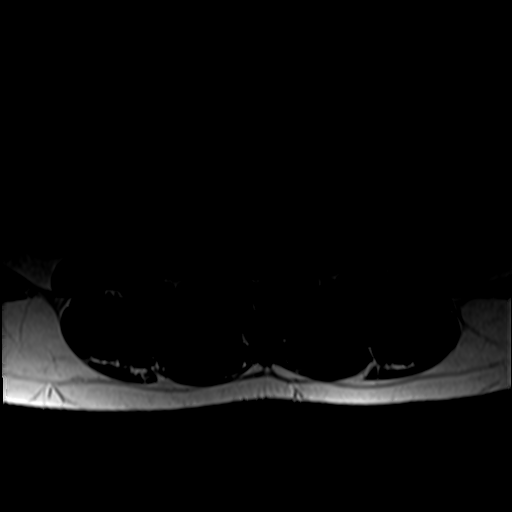

[29 of 48 positions shown; findings below may reference images not displayed]

FINDINGS: Segmentation: Transitional lumbosacral anatomy with partial
sacralization of L5 on the left.

Alignment:  Normal.

Vertebrae: No fracture, suspicious osseous lesion, or significant
marrow edema.

Conus medullaris and cauda equina: Conus extends to the T12-L1
level. Conus and cauda equina appear normal.

Paraspinal and other soft tissues: Unremarkable.

Disc levels:

L1-2 through L3-4: Negative.

L4-5: Disc desiccation and mild disc space narrowing. Disc bulging
and a small left paracentral to subarticular disc protrusion result
in mild-to-moderate left lateral recess stenosis and minimal
bilateral neural foraminal narrowing without spinal stenosis.

L5-S1: Negative.
IMPRESSION: Single level disc degeneration at L4-5 with a small disc protrusion
resulting in mild-to-moderate left lateral recess stenosis. No
right-sided neural impingement identified.

## 2022-04-21 ENCOUNTER — Other Ambulatory Visit: Payer: Self-pay | Admitting: Student

## 2022-04-21 DIAGNOSIS — H9042 Sensorineural hearing loss, unilateral, left ear, with unrestricted hearing on the contralateral side: Secondary | ICD-10-CM

## 2022-04-21 DIAGNOSIS — R42 Dizziness and giddiness: Secondary | ICD-10-CM

## 2022-05-07 ENCOUNTER — Ambulatory Visit: Payer: BC Managed Care – PPO | Admitting: Family Medicine

## 2022-05-07 ENCOUNTER — Encounter: Payer: Self-pay | Admitting: Family Medicine

## 2022-05-07 VITALS — BP 130/79 | HR 70 | Temp 97.9°F | Ht 66.0 in | Wt 158.5 lb

## 2022-05-07 DIAGNOSIS — I10 Essential (primary) hypertension: Secondary | ICD-10-CM

## 2022-05-07 DIAGNOSIS — G4762 Sleep related leg cramps: Secondary | ICD-10-CM

## 2022-05-07 DIAGNOSIS — T471X5A Adverse effect of other antacids and anti-gastric-secretion drugs, initial encounter: Secondary | ICD-10-CM

## 2022-05-07 LAB — COMPREHENSIVE METABOLIC PANEL
ALT: 40 U/L (ref 0–53)
AST: 22 U/L (ref 0–37)
Albumin: 4.5 g/dL (ref 3.5–5.2)
Alkaline Phosphatase: 67 U/L (ref 39–117)
BUN: 8 mg/dL (ref 6–23)
CO2: 30 mEq/L (ref 19–32)
Calcium: 9.7 mg/dL (ref 8.4–10.5)
Chloride: 103 mEq/L (ref 96–112)
Creatinine, Ser: 0.92 mg/dL (ref 0.40–1.50)
GFR: 102.92 mL/min (ref >60.00)
Glucose, Bld: 70 mg/dL (ref 70–99)
Potassium: 4.5 mEq/L (ref 3.5–5.1)
Sodium: 140 mEq/L (ref 135–145)
Total Bilirubin: 0.6 mg/dL (ref 0.2–1.2)
Total Protein: 6.6 g/dL (ref 6.0–8.3)

## 2022-05-07 LAB — CBC WITH DIFFERENTIAL/PLATELET
Basophils Absolute: 0.1 10*3/uL (ref 0.0–0.1)
Basophils Relative: 0.8 % (ref 0.0–3.0)
Eosinophils Absolute: 0.6 10*3/uL (ref 0.0–0.7)
Eosinophils Relative: 7.1 % — ABNORMAL HIGH (ref 0.0–5.0)
HCT: 48.2 % (ref 39.0–52.0)
Hemoglobin: 16.5 g/dL (ref 13.0–17.0)
Lymphocytes Relative: 27.2 % (ref 12.0–46.0)
Lymphs Abs: 2.1 10*3/uL (ref 0.7–4.0)
MCHC: 34.3 g/dL (ref 30.0–36.0)
MCV: 91.4 fl (ref 78.0–100.0)
Monocytes Absolute: 0.7 10*3/uL (ref 0.1–1.0)
Monocytes Relative: 8.8 % (ref 3.0–12.0)
Neutro Abs: 4.4 10*3/uL (ref 1.4–7.7)
Neutrophils Relative %: 56.1 % (ref 43.0–77.0)
Platelets: 422 10*3/uL — ABNORMAL HIGH (ref 150.0–400.0)
RBC: 5.27 Mil/uL (ref 4.22–5.81)
RDW: 13.4 % (ref 11.5–15.5)
WBC: 7.8 10*3/uL (ref 4.0–10.5)

## 2022-05-07 LAB — MAGNESIUM: Magnesium: 2.2 mg/dL (ref 1.5–2.5)

## 2022-05-07 LAB — FERRITIN: Ferritin: 140 ng/mL (ref 22.0–322.0)

## 2022-05-07 LAB — VITAMIN D 25 HYDROXY (VIT D DEFICIENCY, FRACTURES): VITD: 20.73 ng/mL — ABNORMAL LOW (ref 30.00–100.00)

## 2022-05-07 LAB — IRON: Iron: 137 ug/dL (ref 42–165)

## 2022-05-07 LAB — VITAMIN B12: Vitamin B-12: 321 pg/mL (ref 211–911)

## 2022-05-07 NOTE — Progress Notes (Unsigned)
Subjective:    Patient ID: Seth Bates, male    DOB: [birth year], 42 y.o.   MRN: [id number]  HPI Pt presents for c/o leg cramps  Wt Readings from Last 3 Encounters:  05/07/22 158 lb 8 oz (71.9 kg)  03/15/22 154 lb 6 oz (70 kg)  02/16/22 157 lb 2 oz (71.3 kg)   25.58 kg/m  Vitals:   05/07/22 1133 05/07/22 1155  BP: (!) 148/74 130/79  Pulse: 70   Temp: 97.9 F (36.6 C)   SpO2: 97%     Having cramps in calf muscles at night  Likes charlie hoarse   Happens 3-4 times per week  Comes and goes   No new exercise or activity  Not dehydrated   Gets at last 60-64 oz per day  Water  Light soda  Gatorade  Not sweating more   GERD  Omeprazole - as needed but not every day   Was on prednisone for a while (cramping worsened after her stopped)  Made him pee more   No home remidies   Patient Active Problem List   Diagnosis Date Noted   Adverse effect of proton pump inhibitor 05/07/2022   Nocturnal leg cramps 05/07/2022   Vertigo, benign positional 02/16/2022   Epigastric pain 11/30/2021   IBS (irritable bowel syndrome) 11/30/2021   Motion sickness 07/13/2021   Protrusion of lumbar intervertebral disc 01/27/2021   Acid reflux 09/24/2020   Other intervertebral disc degeneration, lumbar region 03/25/2020   Hyperlipidemia 03/19/2020   Elevated ALT measurement 03/19/2020   Hypertension 03/05/2020   Chronic headache 03/05/2020   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 No pertinent surgical history. Social History   Tobacco Use   Smoking status: Former    Packs/day: .25    Types: Cigarettes    Quit date: 11/01/2016    Years since quitting: 5.5   Smokeless tobacco: Never  Vaping Use   Vaping Use: Never used  Substance Use Topics   Alcohol  use: No    Alcohol/week: 0.0 standard drinks of alcohol   Drug use: No   History reviewed. No pertinent family history. Allergies  Allergen Reactions   Robinul [Glycopyrrolate]     Dizzy, abdominal distention   Current Outpatient Medications on File Prior to Visit  Medication Sig Dispense Refill   dicyclomine (BENTYL) 10 MG capsule Take 1 capsule (10 mg total) by mouth 3 (three) times daily as needed for spasms. 90 capsule 11   famotidine (PEPCID) 20 MG tablet Take 1 tablet (20 mg total) by mouth 2 (two) times daily. 60 tablet 3   meclizine (ANTIVERT) 25 MG tablet Take 1 tablet (25 mg total) by mouth 3 (three) times daily as needed for dizziness. Caution of sedation 20 tablet 0   metoprolol succinate (TOPROL-XL) 50 MG 24 hr tablet TAKE 1 TABLET BY MOUTH DAILY. TAKE WITH OR IMMEDIATELY FOLLOWING A MEAL. NEEDS OFFICE VISIT 90 tablet 3   omeprazole (PRILOSEC) 20 MG capsule Take 1 capsule (20 mg total) by mouth 2 (two) times daily before a meal. 180 capsule 1   triamcinolone (NASACORT) 55 MCG/ACT AERO nasal inhaler Place 2 sprays into the nose daily. 1 each 10   No current facility-administered medications on file prior to visit.    Review of  Systems  Constitutional:  Negative for activity change, appetite change, fatigue, fever and unexpected weight change.  HENT:  Negative for congestion, rhinorrhea, sore throat and trouble swallowing.   Eyes:  Negative for pain, redness, itching and visual disturbance.  Respiratory:  Negative for cough, chest tightness, shortness of breath and wheezing.   Cardiovascular:  Negative for chest pain and palpitations.  Gastrointestinal:  Negative for abdominal pain, blood in stool, constipation, diarrhea and nausea.  Endocrine: Negative for cold intolerance, heat intolerance, polydipsia and polyuria.  Genitourinary:  Negative for difficulty urinating, dysuria, frequency and urgency.  Musculoskeletal:  Positive for myalgias. Negative for arthralgias and joint  swelling.       Noctuornal leg cramps  Skin:  Negative for pallor and rash.  Neurological:  Negative for dizziness, tremors, weakness, numbness and headaches.  Hematological:  Negative for adenopathy. Does not bruise/bleed easily.  Psychiatric/Behavioral:  Negative for decreased concentration and dysphoric mood. The patient is not nervous/anxious.        Objective:   Physical Exam Constitutional:      General: He is not in acute distress.    Appearance: Normal appearance. He is well-developed and normal weight. He is not ill-appearing or diaphoretic.  HENT:     Head: Normocephalic and atraumatic.  Eyes:     Conjunctiva/sclera: Conjunctivae normal.     Pupils: Pupils are equal, round, and reactive to light.  Neck:     Thyroid: No thyromegaly.     Vascular: No carotid bruit or JVD.  Cardiovascular:     Rate and Rhythm: Normal rate and regular rhythm.     Pulses: Normal pulses.     Heart sounds: Normal heart sounds.     No gallop.  Pulmonary:     Effort: Pulmonary effort is normal. No respiratory distress.     Breath sounds: Normal breath sounds. No wheezing or rales.  Abdominal:     General: There is no distension or abdominal bruit.     Palpations: Abdomen is soft.  Musculoskeletal:        General: No swelling or tenderness.     Cervical back: Normal range of motion and neck supple.     Right lower leg: No edema.     Left lower leg: No edema.     Comments: Nl calf tenderness or swelling Nl pulses  Nl rom feet/ankles and knees    Lymphadenopathy:     Cervical: No cervical adenopathy.  Skin:    General: Skin is warm and dry.     Coloration: Skin is not jaundiced or pale.     Findings: No bruising or rash.  Neurological:     Mental Status: He is alert.     Cranial Nerves: No cranial nerve deficit.     Sensory: No sensory deficit.     Motor: No weakness.     Coordination: Coordination normal.     Gait: Gait normal.     Deep Tendon Reflexes: Reflexes are normal and  symmetric. Reflexes normal.  Psychiatric:        Mood and Affect: Mood normal.           Assessment & Plan:   Problem List Items Addressed This Visit       Cardiovascular and Mediastinum   Hypertension    bp in fair control at this time  BP Readings from Last 1 Encounters:  05/07/22 130/79  No changes needed Most recent labs reviewed  Disc lifstyle change with low sodium diet and exercise  Plan to continue metoprolol xl 50 mg daily       Relevant Orders   Comprehensive metabolic panel (Completed)   CBC with Differential/Platelet (Completed)     Other   Adverse effect of proton pump inhibitor - Primary    Vit B12 and D and iron added to labs today      Relevant Orders   VITAMIN D 25 Hydroxy (Vit-D Deficiency, Fractures) (Completed)   Vitamin B12 (Completed)   Magnesium (Completed)   Iron (Completed)   Ferritin (Completed)   Nocturnal leg cramps    More problematic lately  Leaves him with sore legs  Is well hydrated and not sweating more  No vomiting or diarrhea  Lab today incl vit levels and mag Disc stretching / calves/ hamstrings/ quads  Recommend trial of oral mustard daily   Further rec after labs         Relevant Orders   Comprehensive metabolic panel (Completed)   CBC with Differential/Platelet (Completed)   VITAMIN D 25 Hydroxy (Vit-D Deficiency, Fractures) (Completed)   Vitamin B12 (Completed)   Magnesium (Completed)   Iron (Completed)   Ferritin (Completed)

## 2022-05-07 NOTE — Patient Instructions (Addendum)
Stretch your calf muscles when you can   Drink lots of fluids   Try a teaspoon of mustard every evening   Lab today

## 2022-05-09 NOTE — Assessment & Plan Note (Signed)
bp in fair control at this time  BP Readings from Last 1 Encounters:  05/07/22 130/79   No changes needed Most recent labs reviewed  Disc lifstyle change with low sodium diet and exercise  Plan to continue metoprolol xl 50 mg daily

## 2022-05-09 NOTE — Assessment & Plan Note (Signed)
Vit B12 and D and iron added to labs today

## 2022-05-09 NOTE — Assessment & Plan Note (Signed)
More problematic lately  Leaves him with sore legs  Is well hydrated and not sweating more  No vomiting or diarrhea  Lab today incl vit levels and mag Disc stretching / calves/ hamstrings/ quads  Recommend trial of oral mustard daily   Further rec after labs

## 2022-05-10 ENCOUNTER — Encounter: Payer: Self-pay | Admitting: *Deleted

## 2022-05-20 ENCOUNTER — Ambulatory Visit
Admission: RE | Admit: 2022-05-20 | Discharge: 2022-05-20 | Disposition: A | Discharge status: Home / Self Care | Payer: BC Managed Care – PPO | Source: Ambulatory Visit | Attending: Student | Admitting: Student

## 2022-05-20 DIAGNOSIS — H9042 Sensorineural hearing loss, unilateral, left ear, with unrestricted hearing on the contralateral side: Secondary | ICD-10-CM

## 2022-05-20 DIAGNOSIS — R42 Dizziness and giddiness: Secondary | ICD-10-CM

## 2022-05-20 MED ORDER — GADOPICLENOL 0.5 MMOL/ML IV SOLN
7.5000 mL | Freq: Once | INTRAVENOUS | Status: AC | PRN
  Administered 2022-05-20: 7.5 mL via INTRAVENOUS

## 2022-10-03 ENCOUNTER — Other Ambulatory Visit: Payer: Self-pay | Admitting: Family Medicine

## 2023-01-09 ENCOUNTER — Other Ambulatory Visit: Payer: Self-pay | Admitting: Family Medicine

## 2023-01-10 NOTE — Telephone Encounter (Signed)
Patient scheduled.

## 2023-01-10 NOTE — Telephone Encounter (Signed)
Pt is due for a med refill f/u appt, please schedule and then route back to me. thanks

## 2023-01-14 ENCOUNTER — Ambulatory Visit: Payer: BC Managed Care – PPO | Admitting: Family Medicine

## 2023-01-14 ENCOUNTER — Encounter: Payer: Self-pay | Admitting: Family Medicine

## 2023-01-14 VITALS — BP 122/84 | HR 77 | Temp 98.1°F | Ht 66.0 in | Wt 163.2 lb

## 2023-01-14 DIAGNOSIS — K219 Gastro-esophageal reflux disease without esophagitis: Secondary | ICD-10-CM

## 2023-01-14 DIAGNOSIS — I10 Essential (primary) hypertension: Secondary | ICD-10-CM

## 2023-01-14 DIAGNOSIS — K580 Irritable bowel syndrome with diarrhea: Secondary | ICD-10-CM | POA: Diagnosis not present

## 2023-01-14 DIAGNOSIS — E7800 Pure hypercholesterolemia, unspecified: Secondary | ICD-10-CM

## 2023-01-14 MED ORDER — DICYCLOMINE HCL 10 MG PO CAPS: 10.0000 mg | ORAL_CAPSULE | Freq: Three times a day (TID) | ORAL | 2 refills | Status: DC | PRN

## 2023-01-14 NOTE — Assessment & Plan Note (Signed)
bp in fair control at this time  BP Readings from Last 1 Encounters:  01/14/23 122/84   No changes needed Most recent labs reviewed  Disc lifstyle change with low sodium diet and exercise  Plan to continue metoprolol xl 50 mg daily

## 2023-01-14 NOTE — Progress Notes (Unsigned)
Subjective:    Patient ID: Seth Bates, male    DOB: 1980-04-30, 42 y.o.   MRN: [id number]  HPI  Wt Readings from Last 3 Encounters:  01/14/23 163 lb 4 oz (74 kg)  05/07/22 158 lb 8 oz (71.9 kg)  03/15/22 154 lb 6 oz (70 kg)   26.35 kg/m  Vitals:   01/14/23 1419  BP: 122/84  Pulse: 77  Temp: 98.1 F (36.7 C)  SpO2: 99%    Pt presents for follow up of  GI issues and chronic medical problems   HTN bp is stable today  No cp or palpitations or headaches or edema  No side effects to medicines  BP Readings from Last 3 Encounters:  01/14/23 122/84  05/07/22 130/79  03/15/22 136/80    Metoprolol xl 50 mg daily   Pulse Readings from Last 3 Encounters:  01/14/23 77  05/07/22 70  03/15/22 79   GERD Takes omeprazole 20 mg bid prn - not that often anymore  Also pepcid 20 mg bid - not taking it / may have made his stomach hurt   IBS Has seen Dr Russella Dar in past  Causes intermittent abd pain and diarrhea  Takes dicyclomine prn and needs a refill   Does not take it a lot - but likes to keep on hand Only takes when he really needs it (almost always in the am)  In past had side effects from glycopyrrolate so it was d/c   Last visit was 2021  Last colonoscopy was 2016 -hyperplastic polyp noted    Triggering foods  Coke- stopped them  Spicy food-eating less  Greasy food- eating less   Fiber did not help      Patient Active Problem List   Diagnosis Date Noted   Adverse effect of proton pump inhibitor 05/07/2022   Nocturnal leg cramps 05/07/2022   Vertigo, benign positional 02/16/2022   Epigastric pain 11/30/2021   IBS (irritable bowel syndrome) 11/30/2021   Motion sickness 07/13/2021   Protrusion of lumbar intervertebral disc 01/27/2021   Acid reflux 09/24/2020   Other intervertebral disc degeneration, lumbar region 03/25/2020   Hyperlipidemia 03/19/2020   Elevated ALT measurement 03/19/2020   Hypertension 03/05/2020   Chronic headache 03/05/2020    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 No pertinent surgical history. Social History   Tobacco Use   Smoking status: Former    Current packs/day: 0.00    Types: Cigarettes    Quit date: 11/01/2016    Years since quitting: 6.2   Smokeless tobacco: Never  Vaping Use   Vaping status: Never Used  Substance Use Topics   Alcohol use: No    Alcohol/week: 0.0 standard drinks of alcohol   Drug use: No   History reviewed. No pertinent family history. Allergies  Allergen Reactions   Robinul [Glycopyrrolate]     Dizzy, abdominal distention   Current Outpatient Medications on File Prior to Visit  Medication Sig Dispense Refill   meclizine (ANTIVERT) 25 MG tablet Take 1 tablet (25 mg total) by mouth 3 (three) times daily as needed for dizziness. Caution of sedation 20 tablet 0   metoprolol succinate (TOPROL-XL) 50 MG 24 hr tablet TAKE 1 TABLET BY MOUTH DAILY. TAKE WITH OR IMMEDIATELY FOLLOWING A MEAL. NEEDS OFFICE  VISIT 90 tablet 0   omeprazole (PRILOSEC) 20 MG capsule Take 1 capsule (20 mg total) by mouth 2 (two) times daily before a meal. (Patient taking differently: Take 20 mg by mouth daily as needed.) 180 capsule 1   triamcinolone (NASACORT) 55 MCG/ACT AERO nasal inhaler Place 2 sprays into the nose daily. 1 each 10   No current facility-administered medications on file prior to visit.    Review of Systems     Objective:   Physical Exam        Assessment & Plan:   Problem List Items Addressed This Visit       Cardiovascular and Mediastinum   Hypertension   bp in fair control at this time  BP Readings from Last 1 Encounters:  01/14/23 122/84   No changes needed Most recent labs reviewed  Disc lifstyle change with low sodium diet and exercise  Plan  to continue metoprolol xl 50 mg daily         Digestive   IBS (irritable bowel syndrome) - Primary   Reviewed last GI notes and colonoscopy (2016 and 2021) Doing well overall Most symptoms are still in am No particular triggers  Dicyclomine is most helpful thing so far-refilled this /does not need every single day  Fiber did not help   Encouraged to eat high fiber diet with plenty of fluids         Relevant Medications   dicyclomine (BENTYL) 10 MG capsule   Acid reflux   Overall doing better if he avoids triggers  Takes omeprazole infrequently as needed every day   Reassuring exam  Reviewed last GI notes       Relevant Medications   dicyclomine (BENTYL) 10 MG capsule     Other   Hyperlipidemia

## 2023-01-14 NOTE — Assessment & Plan Note (Signed)
Overall doing better if he avoids triggers  Takes omeprazole infrequently as needed every day   Reassuring exam  Reviewed last GI notes

## 2023-01-14 NOTE — Assessment & Plan Note (Signed)
Reviewed last GI notes and colonoscopy (2016 and 2021) Doing well overall Most symptoms are still in am No particular triggers  Dicyclomine is most helpful thing so far-refilled this /does not need every single day  Fiber did not help   Encouraged to eat high fiber diet with plenty of fluids

## 2023-01-14 NOTE — Patient Instructions (Addendum)
Avoid foods that trigger you  Take care of yourself     I refilled dicyclomine   Eat a balanced high fiber diet  Drink fluids also

## 2023-04-08 ENCOUNTER — Other Ambulatory Visit: Payer: Self-pay | Admitting: Family Medicine

## 2023-04-12 ENCOUNTER — Other Ambulatory Visit: Payer: Self-pay | Admitting: Family Medicine

## 2023-04-12 NOTE — Telephone Encounter (Signed)
 Last filled on 01/14/23 #90 caps/ 2 refills   Last OV was a med refill on 01/14/23

## 2023-09-26 ENCOUNTER — Ambulatory Visit: Payer: Self-pay

## 2023-09-26 NOTE — Telephone Encounter (Signed)
Appt scheduled with PCP today.

## 2023-09-26 NOTE — Telephone Encounter (Signed)
 FYI Only or Action Required?: FYI only for provider.  Patient was last seen in primary care on 01/14/2023 by Randeen Laine LABOR, MD.  Called Nurse Triage reporting Otalgia.  Symptoms began several days ago.  Interventions attempted: Nothing.  Symptoms are: unchanged.  Triage Disposition: See Physician Within 24 Hours  Patient/caregiver understands and will follow disposition?: Yes     Copied from CRM #[id number]. Topic: Clinical - Red Word Triage >> Sep 26, 2023  8:31 AM Adelita E wrote: [facility] that prompted transfer to Nurse Triage: Right ear pain, symptoms going on for 3 days. Reason for Disposition  Earache  (Exceptions: Brief ear pain of lasting less than 60 minutes, or earache occurring during air travel.)  Answer Assessment - Initial Assessment Questions 1. LOCATION: Which ear is involved?     R ear pain 2. ONSET: When did the ear pain start?      3 days ago 3. SEVERITY: How bad is the pain?  (Scale 1-10; mild, moderate or severe)     4-5/10 Has not tried anything for pain 4. URI SYMPTOMS: Do you have a runny nose or cough?     denies 5. FEVER: Do you have a fever? If Yes, ask: What is your temperature, how was it measured, and when did it start?     denies 6. CAUSE: Have you been swimming recently?, How often do you use Q-TIPS?, Have you had any recent air travel or scuba diving?     Endorses swimming 1 mo ago, denies others 7. OTHER SYMPTOMS: Do you have any other symptoms? (e.g., decreased hearing, dizziness, headache, stiff neck, vomiting)     denies 8. PREGNANCY: Is there any chance you are pregnant? When was your last menstrual period?     N/a  Protocols used: [place]

## 2023-09-26 NOTE — Telephone Encounter (Signed)
 Will see patient then Agree with ER and UC precautions

## 2023-09-27 ENCOUNTER — Ambulatory Visit: Payer: Self-pay | Admitting: Family Medicine

## 2023-09-27 ENCOUNTER — Encounter: Payer: Self-pay | Admitting: Family Medicine

## 2023-09-27 VITALS — BP 120/79 | HR 60 | Temp 98.2°F | Ht 66.0 in | Wt 166.2 lb

## 2023-09-27 DIAGNOSIS — J301 Allergic rhinitis due to pollen: Secondary | ICD-10-CM | POA: Diagnosis not present

## 2023-09-27 DIAGNOSIS — H8113 Benign paroxysmal vertigo, bilateral: Secondary | ICD-10-CM

## 2023-09-27 DIAGNOSIS — H6993 Unspecified Eustachian tube disorder, bilateral: Secondary | ICD-10-CM | POA: Diagnosis not present

## 2023-09-27 DIAGNOSIS — H6990 Unspecified Eustachian tube disorder, unspecified ear: Secondary | ICD-10-CM | POA: Insufficient documentation

## 2023-09-27 MED ORDER — IPRATROPIUM BROMIDE 0.06 % NA SOLN: 2.0000 | Freq: Three times a day (TID) | NASAL | 5 refills | Status: AC

## 2023-09-27 MED ORDER — PREDNISONE 10 MG PO TABS: ORAL_TABLET | ORAL | 0 refills | Status: AC

## 2023-09-27 NOTE — Assessment & Plan Note (Signed)
 Acute on chronic nasal congestion- causing ETD and vertigo at times  Intol of nasal steroids (cause headache)   Will try atrovent  ns as directed   In past antihistamine does not help    Call back and Er precautions noted in detail today

## 2023-09-27 NOTE — Assessment & Plan Note (Signed)
 Worse on right Acute on chronic  In setting of allergy congestion (unable to tolerate nasal steroid sprays)  Also intermittent vertigo   Reassuring exam  Prednisone  40 mg taper prescription  Also atrovent  ns to try  Update if not starting to improve in a week or if worsening    Call back and Er precautions noted in detail today    Handout given

## 2023-09-27 NOTE — Assessment & Plan Note (Signed)
 Had a little dizziness this weekend in setting of ETD Better today Reassuring exam  Will treat with prednisone 

## 2023-09-27 NOTE — Patient Instructions (Signed)
 Take prednisone  as directed to open ear tubes and sinuses  It will make you feel hyper and hungry   Nasal saline is fine   Watch for more green mucous or sinus pain and pressure or fever/ headache  Or if vertigo returns   Try atrovent  nasal spray for chronic congestion (to help open sinuses/nose and ears)  It is different then the steroid sprays that caused headache   Update if not starting to improve in a week or if worsening

## 2023-09-27 NOTE — Progress Notes (Signed)
 Subjective:    Patient ID: Seth Bates, male    DOB: May 03, 1980, 43 y.o.   MRN: [id number]  HPI  Wt Readings from Last 3 Encounters:  09/27/23 166 lb 4 oz (75.4 kg)  01/14/23 163 lb 4 oz (74 kg)  05/07/22 158 lb 8 oz (71.9 kg)   26.83 kg/m  Vitals:   09/27/23 1131 09/27/23 1149  BP: (!) 142/96 120/79  Pulse: 60   Temp: 98.2 F (36.8 C)   SpO2: 98%     Pt presents with c/o Right ear pain  Nasal congestion -for months  Dizziness-this weekend/ better now   Nasal congestion for over a month (started at the beach)  Both nostrils  Eyes are watery/at times itchy  No sinus pain  Some green nasal discharge   No cough  No ST  Last weekend- had some mild vertigo      3-4 days  Right ear Inside and outside  Yesterday - a bit on the left also (inside) Moderate pain - dull ache  No fever    History of allergic rhinitis  History of ETD in past   Over the counter Nasal saline  Zyrtec did not help  Nasacort  - thinks gave him headache So did flonase     Patient Active Problem List   Diagnosis Date Noted   ETD (eustachian tube dysfunction) 09/27/2023   Adverse effect of proton pump inhibitor 05/07/2022   Nocturnal leg cramps 05/07/2022   Vertigo, benign positional 02/16/2022   Epigastric pain 11/30/2021   IBS (irritable bowel syndrome) 11/30/2021   Motion sickness 07/13/2021   Protrusion of lumbar intervertebral disc 01/27/2021   Acid reflux 09/24/2020   Other intervertebral disc degeneration, lumbar region 03/25/2020   Hyperlipidemia 03/19/2020   Elevated ALT measurement 03/19/2020   Hypertension 03/05/2020   Chronic headache 03/05/2020   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 No pertinent surgical history. Social History    Tobacco Use   Smoking status: Former    Current packs/day: 0.00    Types: Cigarettes    Quit date: 11/01/2016    Years since quitting: 6.9   Smokeless tobacco: Never  Vaping Use   Vaping status: Never Used  Substance Use Topics   Alcohol use: No    Alcohol/week: 0.0 standard drinks of alcohol   Drug use: No   History reviewed. No pertinent family history. Allergies  Allergen Reactions   Flonase  [Fluticasone ]     Headache    Nasacort  [Triamcinolone ]     Headache    Robinul  [Glycopyrrolate ]     Dizzy, abdominal distention   Current Outpatient Medications on File Prior to Visit  Medication Sig Dispense Refill   dicyclomine  (BENTYL ) 10 MG capsule TAKE 1 CAPSULE (10 MG TOTAL) BY MOUTH 3 (THREE) TIMES DAILY AS NEEDED FOR SPASMS. 270 capsule 0   meclizine  (ANTIVERT ) 25 MG tablet Take 1 tablet (25 mg total) by mouth 3 (three) times daily as needed for dizziness. Caution of sedation 20 tablet 0   metoprolol  succinate (TOPROL -XL) 50 MG 24 hr tablet TAKE 1 TABLET BY MOUTH DAILY. TAKE WITH OR IMMEDIATELY FOLLOWING A MEAL. NEEDS OFFICE VISIT 90 tablet 2   raNITIdine  HCl (ZANTAC  PO) Take 1 tablet by mouth daily as needed.     No  current facility-administered medications on file prior to visit.    Review of Systems     Objective:   Physical Exam Constitutional:      General: He is not in acute distress.    Appearance: Normal appearance. He is well-developed and normal weight. He is not ill-appearing or diaphoretic.  HENT:     Head: Normocephalic and atraumatic.     Comments: No facial swelling or tenderness    Right Ear: Ear canal and external ear normal.     Left Ear: Ear canal and external ear normal.     Ears:     Comments: Right TM is dull and retracted Left TM is dull   No effusion  No erythema or bulging     Nose: Congestion present. No rhinorrhea.     Mouth/Throat:     Mouth: Mucous membranes are moist.     Pharynx: No oropharyngeal exudate or posterior  oropharyngeal erythema.  Eyes:     General:        Right eye: No discharge.        Left eye: No discharge.     Extraocular Movements: Extraocular movements intact.     Conjunctiva/sclera: Conjunctivae normal.     Pupils: Pupils are equal, round, and reactive to light.     Comments: No nystagmus   Neck:     Thyroid: No thyromegaly.     Vascular: No carotid bruit or JVD.  Cardiovascular:     Rate and Rhythm: Normal rate and regular rhythm.     Heart sounds: Normal heart sounds.     No gallop.  Pulmonary:     Effort: Pulmonary effort is normal. No respiratory distress.     Breath sounds: Normal breath sounds. No stridor. No wheezing, rhonchi or rales.  Abdominal:     General: There is no distension or abdominal bruit.     Palpations: Abdomen is soft.  Musculoskeletal:     Cervical back: Normal range of motion and neck supple.     Right lower leg: No edema.     Left lower leg: No edema.  Lymphadenopathy:     Cervical: No cervical adenopathy.  Skin:    General: Skin is warm and dry.     Coloration: Skin is not pale.     Findings: No rash.  Neurological:     Mental Status: He is alert.     Coordination: Coordination normal.     Deep Tendon Reflexes: Reflexes are normal and symmetric. Reflexes normal.  Psychiatric:        Mood and Affect: Mood normal.           Assessment & Plan:   Problem List Items Addressed This Visit       Respiratory   Allergic rhinitis   Acute on chronic nasal congestion- causing ETD and vertigo at times  Intol of nasal steroids (cause headache)   Will try atrovent  ns as directed   In past antihistamine does not help    Call back and Er precautions noted in detail today          Nervous and Auditory   Vertigo, benign positional   Had a little dizziness this weekend in setting of ETD Better today Reassuring exam  Will treat with prednisone        ETD (eustachian tube dysfunction) - Primary   Worse on right Acute on chronic  In  setting of allergy congestion (unable to tolerate nasal steroid sprays)  Also intermittent vertigo  Reassuring exam  Prednisone  40 mg taper prescription  Also atrovent  ns to try  Update if not starting to improve in a week or if worsening    Call back and Er precautions noted in detail today    Handout given
